# Patient Record
Sex: Female | Born: 1940
Health system: Southern US, Community
[De-identification: ages and names within clinical notes are randomized; demographics above are authoritative.]

## PROBLEM LIST (undated history)

## (undated) DIAGNOSIS — I10 Essential (primary) hypertension: Secondary | ICD-10-CM

## (undated) DIAGNOSIS — E785 Hyperlipidemia, unspecified: Secondary | ICD-10-CM

## (undated) DIAGNOSIS — I252 Old myocardial infarction: Secondary | ICD-10-CM

## (undated) HISTORY — PX: NO PAST SURGERIES: SHX2092

---

## 2008-06-03 ENCOUNTER — Emergency Department (HOSPITAL_BASED_OUTPATIENT_CLINIC_OR_DEPARTMENT_OTHER): Admission: EM | Admit: 2008-06-03 | Discharge: 2008-06-04 | Payer: Self-pay | Admitting: Emergency Medicine

## 2008-10-26 ENCOUNTER — Emergency Department (HOSPITAL_BASED_OUTPATIENT_CLINIC_OR_DEPARTMENT_OTHER): Admission: EM | Admit: 2008-10-26 | Discharge: 2008-10-26 | Payer: Self-pay | Admitting: Emergency Medicine

## 2011-08-30 LAB — POCT CARDIAC MARKERS: CKMB, poc: 1.3

## 2014-12-01 DIAGNOSIS — R7301 Impaired fasting glucose: Secondary | ICD-10-CM | POA: Diagnosis not present

## 2015-01-22 ENCOUNTER — Emergency Department (HOSPITAL_BASED_OUTPATIENT_CLINIC_OR_DEPARTMENT_OTHER)
Admission: EM | Admit: 2015-01-22 | Discharge: 2015-01-22 | Disposition: A | Discharge status: Home / Self Care | Payer: Medicare Other | Attending: Emergency Medicine | Admitting: Emergency Medicine

## 2015-01-22 ENCOUNTER — Encounter (HOSPITAL_BASED_OUTPATIENT_CLINIC_OR_DEPARTMENT_OTHER): Payer: Self-pay | Admitting: Emergency Medicine

## 2015-01-22 ENCOUNTER — Emergency Department (HOSPITAL_BASED_OUTPATIENT_CLINIC_OR_DEPARTMENT_OTHER): Payer: Medicare Other

## 2015-01-22 DIAGNOSIS — M4317 Spondylolisthesis, lumbosacral region: Secondary | ICD-10-CM | POA: Diagnosis not present

## 2015-01-22 DIAGNOSIS — I10 Essential (primary) hypertension: Secondary | ICD-10-CM | POA: Insufficient documentation

## 2015-01-22 DIAGNOSIS — Z79899 Other long term (current) drug therapy: Secondary | ICD-10-CM | POA: Diagnosis not present

## 2015-01-22 DIAGNOSIS — E785 Hyperlipidemia, unspecified: Secondary | ICD-10-CM | POA: Diagnosis not present

## 2015-01-22 DIAGNOSIS — M5489 Other dorsalgia: Secondary | ICD-10-CM

## 2015-01-22 DIAGNOSIS — M4316 Spondylolisthesis, lumbar region: Secondary | ICD-10-CM | POA: Diagnosis not present

## 2015-01-22 DIAGNOSIS — Z7982 Long term (current) use of aspirin: Secondary | ICD-10-CM | POA: Diagnosis not present

## 2015-01-22 DIAGNOSIS — M545 Low back pain: Secondary | ICD-10-CM | POA: Insufficient documentation

## 2015-01-22 DIAGNOSIS — Z880 Allergy status to penicillin: Secondary | ICD-10-CM | POA: Diagnosis not present

## 2015-01-22 DIAGNOSIS — M47896 Other spondylosis, lumbar region: Secondary | ICD-10-CM | POA: Diagnosis not present

## 2015-01-22 HISTORY — DX: Essential (primary) hypertension: I10

## 2015-01-22 HISTORY — DX: Hyperlipidemia, unspecified: E78.5

## 2015-01-22 LAB — URINALYSIS, ROUTINE W REFLEX MICROSCOPIC
Bilirubin Urine: NEGATIVE
GLUCOSE, UA: NEGATIVE mg/dL
KETONES UR: NEGATIVE mg/dL
Nitrite: NEGATIVE
PROTEIN: NEGATIVE mg/dL
Specific Gravity, Urine: 1.008 (ref 1.005–1.030)
UROBILINOGEN UA: 0.2 mg/dL (ref 0.0–1.0)
pH: 6 (ref 5.0–8.0)

## 2015-01-22 LAB — URINE MICROSCOPIC-ADD ON

## 2015-01-22 NOTE — ED Provider Notes (Signed)
CSN: [id number]     Arrival date & time 01/22/15  2040 History  This chart was scribed for Julie Chick, MD by Freida Busman, ED Scribe. This patient was seen in room MH04/MH04 and the patient's care was started 9:51 PM.    Chief Complaint  Patient presents with  . Back Pain    Patient is a 74 y.o. female presenting with back pain. The history is provided by the patient. No language interpreter was used.  Back Pain Location:  Lumbar spine Radiates to:  Does not radiate Timing:  Constant Chronicity:  New Relieved by:  NSAIDs Associated symptoms: no bladder incontinence, no bowel incontinence and no fever      HPI Comments:  DEMECIA Adkins is a 74 y.o. female who presents to the Emergency Department complaining of moderate lower back pain that started today. She noticed the pain shortly after sweeping. Pt states she work-outs 3 times a week but denies acute injury or recent strenuous activity. Her  pain has improved since taking an ibuprofen PTA. She denies weakness to her extremities, bladder/bowel incontinence and fever. She notes a h/o similar pain due to her sciatic nerve.  Past Medical History  Diagnosis Date  . Hypertension   . Hyperlipidemia    History reviewed. No pertinent past surgical history. History reviewed. No pertinent family history. History  Substance Use Topics  . Smoking status: Never Smoker   . Smokeless tobacco: Not on file  . Alcohol Use: No   OB History    No data available     Review of Systems  Constitutional: Negative for fever.  Gastrointestinal: Negative for bowel incontinence.  Genitourinary: Negative for bladder incontinence.  Musculoskeletal: Positive for back pain.  All other systems reviewed and are negative.     Allergies  Penicillins  Home Medications   Prior to Admission medications   Medication Sig Start Date End Date Taking? Authorizing Provider  aspirin 81 MG tablet Take 81 mg by mouth daily.   Yes Historical Provider, MD   nisoldipine (SULAR) 34 MG 24 hr tablet Take 34 mg by mouth daily.   Yes Historical Provider, MD  simvastatin (ZOCOR) 20 MG tablet Take 20 mg by mouth daily.   Yes Historical Provider, MD  triamterene-hydrochlorothiazide (DYAZIDE) 37.5-25 MG per capsule Take 1 capsule by mouth daily.   Yes Historical Provider, MD  vitamin B-12 (CYANOCOBALAMIN) 100 MCG tablet Take 100 mcg by mouth daily.   Yes Historical Provider, MD   BP 137/68 mmHg  Pulse 66  Temp(Src) 97.9 F (36.6 C) (Oral)  Resp 18  Ht  (1.626 m)  Wt 150 lb (68.04 kg)  BMI 25.73 kg/m2  SpO2 98%  Vitals reviewed Physical Exam  Physical Examination: General appearance - alert, well appearing, and in no distress Mental status - alert, oriented to person, place, and time Eyes - no conjunctival injection, no scleral icterus Chest - clear to auscultation, no wheezes, rales or rhonchi, symmetric air entry Heart - normal rate, regular rhythm, normal S1, S2, no murmurs, rubs, clicks or gallops Back exam -some midline lumbar tenderness to palpation, no CVA tenderness, bilateral mild lumbar tenderness to palpation Neurological - alert, orientedx 3, normal speech, normal gait, strength 5/5 in lower extremities Extremities - peripheral pulses normal, no pedal edema, no clubbing or cyanosis Skin - normal coloration and turgor  ED Course  Procedures   DIAGNOSTIC STUDIES:  Oxygen Saturation is 100% on RA, normal by my interpretation.    COORDINATION OF CARE:  9:56 PM will order back xray.   Discussed treatment plan with pt at bedside and pt agreed to plan.  Labs Review Labs Reviewed  URINALYSIS, ROUTINE W REFLEX MICROSCOPIC - Abnormal; Notable for the following:    Hgb urine dipstick SMALL (*)    Leukocytes, UA MODERATE (*)    All other components within normal limits  URINE MICROSCOPIC-ADD ON - Abnormal; Notable for the following:    Bacteria, UA FEW (*)    All other components within normal limits    Imaging Review Dg  Lumbar Spine Complete  01/22/2015   CLINICAL DATA:  Sudden onset of low back pain this afternoon. Pain across the low back. Sciatica. Scoliosis.  EXAM: LUMBAR SPINE - COMPLETE 4+ VIEW  COMPARISON:  11/11/2014.  FINDINGS: Severe multilevel lumbar spondylosis is present. There is a levoconvex curve with the apex at L3-L4. Vertebral body height is preserved. Grade I anterolisthesis of L3 on L4. Collapse of the disc spaces at L3 -L4, L4-L5 and L5-S1. Aortoiliac atherosclerosis is noted. Lower lumbar facet arthrosis.  IMPRESSION: Severe lumbar spondylosis.  No acute osseous abnormality.   Electronically Signed   By: Andreas NewportGeoffrey  Lamke M.D.   On: 01/22/2015 22:38     EKG Interpretation None      MDM   Final diagnoses:  Back pain without sciatica    Pt presenting with c/o low back pain which was relieved prior to arrival with ibuprofen. No signs or symptoms of cauda equina, no fever to suggest epidural abscess.  Xray shows significant degenerative disease which patient states she was already aware of.  Pt advised to arrange for followup with PMD.  Discharged with strict return precautions.  Pt agreeable with plan.  I personally performed the services described in this documentation, which was scribed in my presence. The recorded information has been reviewed and is accurate.    Julie ChickMartha K Linker, MD 01/24/15 [id number]

## 2015-01-22 NOTE — ED Notes (Signed)
Pt states she developed sudden onset of bilateral lower back pain, radiates to flank, no injury

## 2015-01-22 NOTE — Discharge Instructions (Signed)
Return to the ED with any concerns including weakness of legs, not able to urinate, loss of control of bowel or bladder, fever/chills, decreased level of alertness/lethargy, or any other alarming symptoms °

## 2015-03-13 DIAGNOSIS — Z1239 Encounter for other screening for malignant neoplasm of breast: Secondary | ICD-10-CM | POA: Diagnosis not present

## 2015-03-13 DIAGNOSIS — I10 Essential (primary) hypertension: Secondary | ICD-10-CM | POA: Diagnosis not present

## 2015-03-13 DIAGNOSIS — M5440 Lumbago with sciatica, unspecified side: Secondary | ICD-10-CM | POA: Diagnosis not present

## 2015-04-03 DIAGNOSIS — M5440 Lumbago with sciatica, unspecified side: Secondary | ICD-10-CM | POA: Diagnosis not present

## 2015-04-03 DIAGNOSIS — E559 Vitamin D deficiency, unspecified: Secondary | ICD-10-CM | POA: Diagnosis not present

## 2015-04-03 DIAGNOSIS — I10 Essential (primary) hypertension: Secondary | ICD-10-CM | POA: Diagnosis not present

## 2015-04-29 DIAGNOSIS — M545 Low back pain: Secondary | ICD-10-CM | POA: Diagnosis not present

## 2015-04-29 DIAGNOSIS — M5416 Radiculopathy, lumbar region: Secondary | ICD-10-CM | POA: Diagnosis not present

## 2015-05-21 DIAGNOSIS — Z0000 Encounter for general adult medical examination without abnormal findings: Secondary | ICD-10-CM | POA: Diagnosis not present

## 2015-07-13 DIAGNOSIS — S86912A Strain of unspecified muscle(s) and tendon(s) at lower leg level, left leg, initial encounter: Secondary | ICD-10-CM | POA: Diagnosis not present

## 2015-07-28 DIAGNOSIS — B351 Tinea unguium: Secondary | ICD-10-CM | POA: Diagnosis not present

## 2015-07-28 DIAGNOSIS — B353 Tinea pedis: Secondary | ICD-10-CM | POA: Diagnosis not present

## 2015-08-17 DIAGNOSIS — H2513 Age-related nuclear cataract, bilateral: Secondary | ICD-10-CM | POA: Diagnosis not present

## 2015-08-17 DIAGNOSIS — H4011X4 Primary open-angle glaucoma, indeterminate stage: Secondary | ICD-10-CM | POA: Diagnosis not present

## 2015-08-17 DIAGNOSIS — H25013 Cortical age-related cataract, bilateral: Secondary | ICD-10-CM | POA: Diagnosis not present

## 2015-10-16 DIAGNOSIS — E559 Vitamin D deficiency, unspecified: Secondary | ICD-10-CM | POA: Diagnosis not present

## 2015-10-16 DIAGNOSIS — E876 Hypokalemia: Secondary | ICD-10-CM | POA: Diagnosis not present

## 2015-10-16 DIAGNOSIS — Z0000 Encounter for general adult medical examination without abnormal findings: Secondary | ICD-10-CM | POA: Diagnosis not present

## 2015-10-16 DIAGNOSIS — E785 Hyperlipidemia, unspecified: Secondary | ICD-10-CM | POA: Diagnosis not present

## 2015-10-16 DIAGNOSIS — R1032 Left lower quadrant pain: Secondary | ICD-10-CM | POA: Diagnosis not present

## 2015-10-16 DIAGNOSIS — I10 Essential (primary) hypertension: Secondary | ICD-10-CM | POA: Diagnosis not present

## 2015-10-16 DIAGNOSIS — R1031 Right lower quadrant pain: Secondary | ICD-10-CM | POA: Diagnosis not present

## 2015-10-20 DIAGNOSIS — E559 Vitamin D deficiency, unspecified: Secondary | ICD-10-CM | POA: Diagnosis not present

## 2015-10-20 DIAGNOSIS — Z0000 Encounter for general adult medical examination without abnormal findings: Secondary | ICD-10-CM | POA: Diagnosis not present

## 2015-10-20 DIAGNOSIS — E876 Hypokalemia: Secondary | ICD-10-CM | POA: Diagnosis not present

## 2015-10-20 DIAGNOSIS — M25562 Pain in left knee: Secondary | ICD-10-CM | POA: Diagnosis not present

## 2015-10-20 DIAGNOSIS — M2540 Effusion, unspecified joint: Secondary | ICD-10-CM | POA: Diagnosis not present

## 2015-10-20 DIAGNOSIS — I10 Essential (primary) hypertension: Secondary | ICD-10-CM | POA: Diagnosis not present

## 2015-10-20 DIAGNOSIS — Z23 Encounter for immunization: Secondary | ICD-10-CM | POA: Diagnosis not present

## 2015-10-20 DIAGNOSIS — M5416 Radiculopathy, lumbar region: Secondary | ICD-10-CM | POA: Diagnosis not present

## 2015-10-20 DIAGNOSIS — E785 Hyperlipidemia, unspecified: Secondary | ICD-10-CM | POA: Diagnosis not present

## 2015-10-21 DIAGNOSIS — M25562 Pain in left knee: Secondary | ICD-10-CM | POA: Diagnosis not present

## 2015-11-09 DIAGNOSIS — Z01419 Encounter for gynecological examination (general) (routine) without abnormal findings: Secondary | ICD-10-CM | POA: Diagnosis not present

## 2015-11-09 DIAGNOSIS — Z124 Encounter for screening for malignant neoplasm of cervix: Secondary | ICD-10-CM | POA: Diagnosis not present

## 2015-11-10 DIAGNOSIS — Z01419 Encounter for gynecological examination (general) (routine) without abnormal findings: Secondary | ICD-10-CM | POA: Diagnosis not present

## 2015-12-08 DIAGNOSIS — M5416 Radiculopathy, lumbar region: Secondary | ICD-10-CM | POA: Diagnosis not present

## 2015-12-08 DIAGNOSIS — M545 Low back pain: Secondary | ICD-10-CM | POA: Diagnosis not present

## 2016-02-09 ENCOUNTER — Ambulatory Visit (INDEPENDENT_AMBULATORY_CARE_PROVIDER_SITE_OTHER): Payer: Medicare Other | Admitting: Internal Medicine

## 2016-02-09 ENCOUNTER — Ambulatory Visit: Payer: Self-pay | Admitting: Internal Medicine

## 2016-02-09 ENCOUNTER — Encounter: Payer: Self-pay | Admitting: Internal Medicine

## 2016-02-09 VITALS — BP 116/78 | HR 60 | Temp 97.9°F | Resp 16 | Wt 148.0 lb

## 2016-02-09 DIAGNOSIS — I10 Essential (primary) hypertension: Secondary | ICD-10-CM | POA: Insufficient documentation

## 2016-02-09 DIAGNOSIS — M5431 Sciatica, right side: Secondary | ICD-10-CM | POA: Insufficient documentation

## 2016-02-09 DIAGNOSIS — E785 Hyperlipidemia, unspecified: Secondary | ICD-10-CM | POA: Insufficient documentation

## 2016-02-09 MED ORDER — B COMPLEX PO CAPS: ORAL_CAPSULE | ORAL | Status: AC

## 2016-02-09 MED ORDER — VITAMIN D3 50 MCG (2000 UT) PO TABS: ORAL_TABLET | ORAL | Status: AC

## 2016-02-09 NOTE — Assessment & Plan Note (Signed)
Cholesterol has been controlled per pt - on zocor -- blood work done in the last 6 months Exercising Healthy diet Blood work at next visit in 6 months

## 2016-02-09 NOTE — Assessment & Plan Note (Signed)
Exercising only, not taking meds

## 2016-02-09 NOTE — Progress Notes (Signed)
Pre visit review using our clinic review tool, if applicable. No additional management support is needed unless otherwise documented below in the visit note. 

## 2016-02-09 NOTE — Progress Notes (Signed)
Subjective:    Patient ID: Julie Adkins, female    DOB: 01/26/1941, 75 y.o.   MRN: [id number]  HPI She is here to establish with a new pcp.    Hyperlipidemia: She is taking her medication daily. She is compliant with a low fat/cholesterol diet. She is exercising regularly, three times a week. She denies myalgias.   Hypertension: She is taking her medication daily. She is compliant with a low sodium diet.  She denies chest pain, palpitations, edema, shortness of breath and regular headaches. She is exercising regularly.  She does monitor her blood pressure at home, it is well controlled.    Sciatica:  She has chronic pain on a daily basis.  She exercises regularly and it helps. She does not any medication.  She can have pain down her right leg at times when it is severe.   She was told she had a heart murmur recently.  Years ago she was told she had a heart attack, but later was told she did not.  She has a family history of heart disease and wants to see cardiology   Medications and allergies reviewed with patient and updated if appropriate.  There are no active problems to display for this patient.   Current Outpatient Prescriptions on File Prior to Visit  Medication Sig Dispense Refill  . aspirin 81 MG tablet Take 81 mg by mouth daily.    . nisoldipine (SULAR) 34 MG 24 hr tablet Take 34 mg by mouth daily.    . simvastatin (ZOCOR) 20 MG tablet Take 20 mg by mouth daily.    Marland Kitchen triamterene-hydrochlorothiazide (DYAZIDE) 37.5-25 MG per capsule Take 1 capsule by mouth daily.    . vitamin B-12 (CYANOCOBALAMIN) 100 MCG tablet Take 100 mcg by mouth daily.     No current facility-administered medications on file prior to visit.    Past Medical History  Diagnosis Date  . Hypertension   . Hyperlipidemia     No past surgical history on file.  Social History   Social History  . Marital Status: Widowed    Spouse Name: N/A  . Number of Children: N/A  . Years of Education: N/A    Social History Main Topics  . Smoking status: Never Smoker   . Smokeless tobacco: None  . Alcohol Use: No  . Drug Use: None  . Sexual Activity: Not Asked   Other Topics Concern  . None   Social History Narrative    No family history on file.  Review of Systems  Constitutional: Negative for fever, chills and appetite change.  Respiratory: Negative for cough, shortness of breath and wheezing.   Cardiovascular: Positive for palpitations (occasionally). Negative for chest pain and leg swelling.  Gastrointestinal: Negative for nausea, abdominal pain, diarrhea, constipation and blood in stool.       Rare GERD  Genitourinary: Negative for dysuria and hematuria.  Musculoskeletal: Positive for back pain (sciatica). Negative for myalgias.  Skin: Negative for color change and rash.  Neurological: Negative for dizziness, light-headedness and headaches.  Psychiatric/Behavioral: Positive for sleep disturbance (occsional difficulty). Negative for dysphoric mood. The patient is not nervous/anxious.        Objective:   Filed Vitals:   02/09/16 1301  BP: 116/78  Pulse: 60  Temp: 97.9 F (36.6 C)  Resp: 16   Filed Weights   02/09/16 1301  Weight: 148 lb (67.132 kg)   Body mass index is 25.39 kg/(m^2).   Physical Exam Constitutional: She appears  well-developed and well-nourished. No distress.  HENT:  Head: Normocephalic and atraumatic.  Right Ear: External ear normal. Normal ear canal and TM Left Ear: External ear normal.  Normal ear canal and TM Mouth/Throat: Oropharynx is clear and moist.  Normal bilateral ear canals and tympanic membranes  Eyes: Conjunctivae and EOM are normal.  Neck: Neck supple. No tracheal deviation present. No thyromegaly present.  No carotid bruit  Cardiovascular: Normal rate, regular rhythm and normal heart sounds.   2/6 systolic murmur.  No edema. Pulmonary/Chest: Effort normal and breath sounds normal. No respiratory distress. She has no wheezes.  She has no rales.  Abdominal: Soft. She exhibits no distension. There is no tenderness.  Lymphadenopathy: She has no cervical adenopathy.  Skin: Skin is warm and dry. She is not diaphoretic.  Psychiatric: She has a normal mood and affect. Her behavior is normal.       Assessment & Plan:   See Problem List for Assessment and Plan of chronic medical problems.  Follow up in 6 months  Will have records transferred - HM up to date per patient

## 2016-02-09 NOTE — Assessment & Plan Note (Signed)
BP well controlled Current regimen effective and well tolerated Continue current medications at current doses  

## 2016-02-09 NOTE — Patient Instructions (Signed)
   All other Health Maintenance issues reviewed.   All recommended immunizations and age-appropriate screenings are up-to-date.  No immunizations administered today.   Medications reviewed and updated.  No changes recommended at this time.  Your prescription(s) have been submitted to your pharmacy. Please take as directed and contact our office if you believe you are having problem(s) with the medication(s).  A referral was ordered for cardiology  Please followup in 6 months

## 2016-04-29 DIAGNOSIS — M5441 Lumbago with sciatica, right side: Secondary | ICD-10-CM | POA: Diagnosis not present

## 2016-04-29 DIAGNOSIS — M5416 Radiculopathy, lumbar region: Secondary | ICD-10-CM | POA: Diagnosis not present

## 2016-04-29 DIAGNOSIS — M5442 Lumbago with sciatica, left side: Secondary | ICD-10-CM | POA: Diagnosis not present

## 2016-04-29 DIAGNOSIS — G8929 Other chronic pain: Secondary | ICD-10-CM | POA: Diagnosis not present

## 2016-04-29 DIAGNOSIS — M5126 Other intervertebral disc displacement, lumbar region: Secondary | ICD-10-CM | POA: Diagnosis not present

## 2016-05-04 ENCOUNTER — Ambulatory Visit: Payer: Medicare Other | Admitting: Cardiology

## 2016-05-26 ENCOUNTER — Other Ambulatory Visit (INDEPENDENT_AMBULATORY_CARE_PROVIDER_SITE_OTHER): Payer: Medicare Other

## 2016-05-26 ENCOUNTER — Encounter: Payer: Self-pay | Admitting: Family

## 2016-05-26 ENCOUNTER — Telehealth: Payer: Self-pay | Admitting: Family

## 2016-05-26 ENCOUNTER — Ambulatory Visit (INDEPENDENT_AMBULATORY_CARE_PROVIDER_SITE_OTHER): Payer: Medicare Other | Admitting: Family

## 2016-05-26 VITALS — BP 118/80 | HR 62 | Temp 97.7°F | Ht 64.0 in | Wt 142.2 lb

## 2016-05-26 DIAGNOSIS — R1030 Lower abdominal pain, unspecified: Secondary | ICD-10-CM

## 2016-05-26 DIAGNOSIS — N390 Urinary tract infection, site not specified: Secondary | ICD-10-CM

## 2016-05-26 DIAGNOSIS — E876 Hypokalemia: Secondary | ICD-10-CM

## 2016-05-26 LAB — URINALYSIS, ROUTINE W REFLEX MICROSCOPIC
BILIRUBIN URINE: NEGATIVE
Nitrite: NEGATIVE
PH: 6.5 (ref 5.0–8.0)
SPECIFIC GRAVITY, URINE: 1.015 (ref 1.000–1.030)
UROBILINOGEN UA: 0.2 (ref 0.0–1.0)
Urine Glucose: NEGATIVE

## 2016-05-26 LAB — COMPREHENSIVE METABOLIC PANEL
ALK PHOS: 72 U/L (ref 39–117)
ALT: 21 U/L (ref 0–35)
AST: 26 U/L (ref 0–37)
Albumin: 4.3 g/dL (ref 3.5–5.2)
BILIRUBIN TOTAL: 0.6 mg/dL (ref 0.2–1.2)
BUN: 17 mg/dL (ref 6–23)
CALCIUM: 11 mg/dL — AB (ref 8.4–10.5)
CO2: 31 meq/L (ref 19–32)
CREATININE: 0.79 mg/dL (ref 0.40–1.20)
Chloride: 102 mEq/L (ref 96–112)
GFR: 90.94 mL/min (ref >60.00)
Glucose, Bld: 89 mg/dL (ref 70–99)
Potassium: 2.9 mEq/L — ABNORMAL LOW (ref 3.5–5.1)
Sodium: 140 mEq/L (ref 135–145)
TOTAL PROTEIN: 7.8 g/dL (ref 6.0–8.3)

## 2016-05-26 LAB — CBC WITH DIFFERENTIAL/PLATELET
BASOS PCT: 0.5 % (ref 0.0–3.0)
Basophils Absolute: 0 10*3/uL (ref 0.0–0.1)
EOS ABS: 0.1 10*3/uL (ref 0.0–0.7)
Eosinophils Relative: 1.5 % (ref 0.0–5.0)
HCT: 44.7 % (ref 36.0–46.0)
HEMOGLOBIN: 15.2 g/dL — AB (ref 12.0–15.0)
Lymphocytes Relative: 42.6 % (ref 12.0–46.0)
Lymphs Abs: 2.3 10*3/uL (ref 0.7–4.0)
MCHC: 34.1 g/dL (ref 30.0–36.0)
MCV: 89.9 fl (ref 78.0–100.0)
MONO ABS: 0.5 10*3/uL (ref 0.1–1.0)
Monocytes Relative: 9.2 % (ref 3.0–12.0)
NEUTROS ABS: 2.5 10*3/uL (ref 1.4–7.7)
Neutrophils Relative %: 46.2 % (ref 43.0–77.0)
PLATELETS: 281 10*3/uL (ref 150.0–400.0)
RBC: 4.97 Mil/uL (ref 3.87–5.11)
RDW: 14.8 % (ref 11.5–15.5)
WBC: 5.4 10*3/uL (ref 4.0–10.5)

## 2016-05-26 LAB — LIPASE: Lipase: 13 U/L (ref 11.0–59.0)

## 2016-05-26 MED ORDER — POTASSIUM CHLORIDE CRYS ER 20 MEQ PO TBCR: 20.0000 meq | EXTENDED_RELEASE_TABLET | Freq: Every day | ORAL | Status: DC

## 2016-05-26 MED ORDER — NITROFURANTOIN MONOHYD MACRO 100 MG PO CAPS: 100.0000 mg | ORAL_CAPSULE | Freq: Two times a day (BID) | ORAL | Status: DC

## 2016-05-26 NOTE — Progress Notes (Signed)
Subjective:    Patient ID: Julie Adkins, female    DOB: 11/11/1941, 75 y.o.   MRN: [id number]   Julie Adkins is a 75 y.o. female who presents today for an acute visit.    HPI Comments: Patient here for evaluation of left-sided pain. No pain today. Two brief episodes of shooting, sharp left lower abdomen which resolve in seconds. Endorses abdominal bloating over last couple of weeks. First episode two weeks ago and second episode was yesterday. Pain doesn't radiate. Notes she has sciatic and played golf 2 days ago. No injury. No numbness, tingling, dysuria, abdominal pain. Pain not a/w meals. No h/o cancer.  Note, recently noted umbilical hernia.   Has no surgical history including NO hysterectomy.      Past Medical History  Diagnosis Date  . Hypertension   . Hyperlipidemia    Allergies: Penicillins Current Outpatient Prescriptions on File Prior to Visit  Medication Sig Dispense Refill  . aspirin 81 MG tablet Take 81 mg by mouth daily.    . B Complex CAPS Taking daily  0  . Cholecalciferol (VITAMIN D3) 2000 units TABS Taking daily 30 tablet   . simvastatin (ZOCOR) 20 MG tablet Take 20 mg by mouth daily.    Marland Kitchen. triamterene-hydrochlorothiazide (DYAZIDE) 37.5-25 MG per capsule Take 1 capsule by mouth daily.     No current facility-administered medications on file prior to visit.    Social History  Substance Use Topics  . Smoking status: Never Smoker   . Smokeless tobacco: Never Used  . Alcohol Use: No    Review of Systems  Constitutional: Negative for fever, chills and unexpected weight change.  HENT: Negative for congestion, ear pain, sinus pressure and sore throat.   Respiratory: Negative for cough, shortness of breath and wheezing.   Cardiovascular: Negative for chest pain, palpitations and leg swelling.  Gastrointestinal: Negative for nausea, vomiting, abdominal pain, diarrhea, constipation, blood in stool and abdominal distention.  Genitourinary: Negative for dysuria  and hematuria.  Musculoskeletal: Negative for myalgias and arthralgias.  Neurological: Negative for headaches.      Objective:    BP 118/80 mmHg  Pulse 62  Temp(Src) 97.7 F (36.5 C) (Oral)  Ht 5\' 4"  (1.626 m)  Wt 142 lb 4 oz (64.524 kg)  BMI 24.41 kg/m2  SpO2 97%   Physical Exam  Constitutional: She appears well-developed and well-nourished.  Eyes: Conjunctivae are normal.  Cardiovascular: Normal rate, regular rhythm, normal heart sounds and normal pulses.   Pulmonary/Chest: Effort normal and breath sounds normal. She has no wheezes. She has no rhonchi. She has no rales.  Abdominal: Soft. Normal appearance and bowel sounds are normal. She exhibits no distension, no fluid wave, no ascites and no mass. There is no tenderness. There is no rigidity, no rebound, no guarding and no CVA tenderness.  Unable to appreciate any distention. Small, reducible umbilical hernia. Unable to appreciate any bowel.  Neurological: She is alert.  Skin: Skin is warm and dry.  Psychiatric: She has a normal mood and affect. Her speech is normal and behavior is normal. Thought content normal.  Vitals reviewed.      Assessment & Plan:  1. Lower abdominal pain Concern for ovarian mass due to vague symptoms. Alternately, consider muscle strain or constipation.   - Urinalysis, Routine w reflex microscopic ([facility]); Future - Comprehensive metabolic panel; Future - Lipase; Future - CBC with Differential/Platelet; Future - CT Abdomen Pelvis W Contrast; Future     I have  discontinued [name]'s nisoldipine. I am also having her maintain her simvastatin, aspirin, triamterene-hydrochlorothiazide, Vitamin D3, B Complex, amLODipine, chlorthalidone, traMADol, and multivitamin.   Meds ordered this encounter  Medications  . amLODipine (NORVASC) 5 MG tablet    Sig:   . chlorthalidone (HYGROTON) 25 MG tablet    Sig:   . traMADol (ULTRAM) 50 MG tablet    Sig: Take 50 mg by mouth.  . Multiple Vitamin  (MULTIVITAMIN) tablet    Sig: Take by mouth.     Start medications as prescribed and explained to patient on After Visit Summary ( AVS). Risks, benefits, and alternatives of the medications and treatment plan prescribed today were discussed, and patient expressed understanding.   Education regarding symptom management and diagnosis given to patient.   Follow-up:Plan follow-up and return precautions given if any worsening symptoms or change in condition.   Continue to follow with Pincus SanesStacy J Burns, MD for routine health maintenance.   Julie Adkins and I agreed with plan.   Rennie PlowmanMargaret Shaliah Wann, FNP

## 2016-05-26 NOTE — Progress Notes (Signed)
Pre visit review using our clinic review tool, if applicable. No additional management support is needed unless otherwise documented below in the visit note. 

## 2016-05-26 NOTE — Telephone Encounter (Signed)
Spoke with patient regarding low potassium and elevated calcium. We will start a potassium supplement for the next couple days and she will come in on Monday to have her labs rechecked. I also went through her medication list more thoroughly and she is actually not taking the Dyazide. I also told her about urinary tract infection. She believes the pain that she was having was related to urine now and said that if her bloating and pain resolves with antibiotic, she will cancel the CT for abdomen and pelvis.  I discontinued the chlorthalidone due to the hypokalemia;we jointly decided that she may be able to control her blood pressure well on amlodipine 5 mg only. We're trialing this for next couple days and patient will monitor her blood pressure at home BP cuff.

## 2016-05-26 NOTE — Patient Instructions (Signed)
As we discussed, we will do some testing to ensure were not missing anything more serious. Labs and we will call you about CT abdomen.   More than likely, as we discussed, I suspect the pain may be related to muscle strain or muscle spasm due to golf. Please continue to pay very close attention to pain and when it occurs.  If there is no improvement in your symptoms, or if there is any worsening of symptoms, or if you have any additional concerns, please return for re-evaluation; or, if we are closed, consider going to the Emergency Room for evaluation if symptoms urgent.

## 2016-06-01 ENCOUNTER — Other Ambulatory Visit: Payer: Self-pay

## 2016-06-01 ENCOUNTER — Ambulatory Visit: Payer: Medicare Other

## 2016-06-01 ENCOUNTER — Other Ambulatory Visit (INDEPENDENT_AMBULATORY_CARE_PROVIDER_SITE_OTHER): Payer: Medicare Other

## 2016-06-01 VITALS — BP 150/72

## 2016-06-01 DIAGNOSIS — E876 Hypokalemia: Secondary | ICD-10-CM

## 2016-06-01 DIAGNOSIS — I10 Essential (primary) hypertension: Secondary | ICD-10-CM

## 2016-06-01 LAB — COMPREHENSIVE METABOLIC PANEL
ALBUMIN: 4.4 g/dL (ref 3.5–5.2)
ALT: 21 U/L (ref 0–35)
AST: 26 U/L (ref 0–37)
Alkaline Phosphatase: 68 U/L (ref 39–117)
BILIRUBIN TOTAL: 0.6 mg/dL (ref 0.2–1.2)
BUN: 18 mg/dL (ref 6–23)
CALCIUM: 10.7 mg/dL — AB (ref 8.4–10.5)
CHLORIDE: 101 meq/L (ref 96–112)
CO2: 30 mEq/L (ref 19–32)
CREATININE: 0.74 mg/dL (ref 0.40–1.20)
GFR: 98.07 mL/min (ref >60.00)
Glucose, Bld: 78 mg/dL (ref 70–99)
Potassium: 3.2 mEq/L — ABNORMAL LOW (ref 3.5–5.1)
Sodium: 139 mEq/L (ref 135–145)
Total Protein: 7.6 g/dL (ref 6.0–8.3)

## 2016-06-01 MED ORDER — TRIAMTERENE-HCTZ 37.5-25 MG PO CAPS: 1.0000 | ORAL_CAPSULE | Freq: Every day | ORAL | Status: DC

## 2016-06-01 NOTE — Telephone Encounter (Signed)
Called patient and explained -   Stop chlorthalidone and potassium chloride.  Start Dyazide which should help BP and normalize potassium.   Calcium still remains elevated.   Cancel CT pelvis.   Better from UTI.

## 2016-06-01 NOTE — Telephone Encounter (Signed)
Noted  

## 2016-06-01 NOTE — Telephone Encounter (Signed)
Pt come in for a nurse visit today 06/01/16 and her BP read 150/72. She was concerned about the number going up so she wanted me to inform you that she is going back on her fluid pill until she sees you in a couple of weeks. She wants to talk about going on a calcium supplement instead of being taken off the fluid pill due to her calcium being low.

## 2016-06-02 ENCOUNTER — Telehealth: Payer: Self-pay | Admitting: Internal Medicine

## 2016-06-02 ENCOUNTER — Telehealth: Payer: Self-pay | Admitting: Family

## 2016-06-02 DIAGNOSIS — I10 Essential (primary) hypertension: Secondary | ICD-10-CM

## 2016-06-02 MED ORDER — TRIAMTERENE-HCTZ 37.5-25 MG PO CAPS: 1.0000 | ORAL_CAPSULE | Freq: Every day | ORAL | Status: DC

## 2016-06-02 NOTE — Telephone Encounter (Signed)
Spoke with patient about lab results. She will come back next week to recheck calcium and K. She will continue monitor BPs.  She has no abdominal pain and would like for me to cancel CT abdomen.   She will stop chlorthalidone and start Dyazide.

## 2016-06-08 ENCOUNTER — Other Ambulatory Visit: Payer: Self-pay

## 2016-06-09 ENCOUNTER — Telehealth: Payer: Self-pay | Admitting: Internal Medicine

## 2016-06-09 DIAGNOSIS — R300 Dysuria: Secondary | ICD-10-CM

## 2016-06-09 NOTE — Telephone Encounter (Signed)
Patient states Dr. Lawerance BachBurns was treating her for a UTI.  Patient states she believes she still has a UTI.  Patient is requesting order for lab.

## 2016-06-09 NOTE — Telephone Encounter (Signed)
Please advise, pt has UA done by Claris CheMargaret on 05/26/16

## 2016-06-09 NOTE — Telephone Encounter (Signed)
Entered, LVM informing pt.

## 2016-06-09 NOTE — Telephone Encounter (Signed)
Check UA, Urine cx

## 2016-06-10 ENCOUNTER — Other Ambulatory Visit (INDEPENDENT_AMBULATORY_CARE_PROVIDER_SITE_OTHER): Payer: Medicare Other

## 2016-06-10 ENCOUNTER — Ambulatory Visit: Payer: Medicare Other

## 2016-06-10 VITALS — BP 116/68

## 2016-06-10 DIAGNOSIS — I10 Essential (primary) hypertension: Secondary | ICD-10-CM

## 2016-06-10 DIAGNOSIS — R300 Dysuria: Secondary | ICD-10-CM | POA: Diagnosis not present

## 2016-06-10 LAB — URINALYSIS, ROUTINE W REFLEX MICROSCOPIC
BILIRUBIN URINE: NEGATIVE
KETONES UR: NEGATIVE
NITRITE: NEGATIVE
SPECIFIC GRAVITY, URINE: 1.025 (ref 1.000–1.030)
Urine Glucose: NEGATIVE
Urobilinogen, UA: 1 (ref 0.0–1.0)
pH: 6 (ref 5.0–8.0)

## 2016-06-11 LAB — URINE CULTURE

## 2016-06-13 ENCOUNTER — Other Ambulatory Visit (INDEPENDENT_AMBULATORY_CARE_PROVIDER_SITE_OTHER): Payer: Medicare Other

## 2016-06-13 ENCOUNTER — Telehealth: Payer: Self-pay | Admitting: Emergency Medicine

## 2016-06-13 DIAGNOSIS — R300 Dysuria: Secondary | ICD-10-CM

## 2016-06-13 DIAGNOSIS — R109 Unspecified abdominal pain: Secondary | ICD-10-CM

## 2016-06-13 DIAGNOSIS — K30 Functional dyspepsia: Secondary | ICD-10-CM

## 2016-06-13 LAB — URINALYSIS, ROUTINE W REFLEX MICROSCOPIC
BILIRUBIN URINE: NEGATIVE
KETONES UR: NEGATIVE
NITRITE: NEGATIVE
Specific Gravity, Urine: 1.01 (ref 1.000–1.030)
TOTAL PROTEIN, URINE-UPE24: NEGATIVE
URINE GLUCOSE: NEGATIVE
UROBILINOGEN UA: 0.2 (ref 0.0–1.0)
pH: 7.5 (ref 5.0–8.0)

## 2016-06-13 NOTE — Telephone Encounter (Signed)
Urine culture re entered for pt to come back in for a better sample.

## 2016-06-15 LAB — URINE CULTURE

## 2016-07-08 DIAGNOSIS — Z1231 Encounter for screening mammogram for malignant neoplasm of breast: Secondary | ICD-10-CM | POA: Diagnosis not present

## 2016-07-27 ENCOUNTER — Other Ambulatory Visit: Payer: Self-pay | Admitting: *Deleted

## 2016-07-27 MED ORDER — SIMVASTATIN 20 MG PO TABS: 20.0000 mg | ORAL_TABLET | Freq: Every day | ORAL | 1 refills | Status: DC

## 2016-08-11 ENCOUNTER — Ambulatory Visit (INDEPENDENT_AMBULATORY_CARE_PROVIDER_SITE_OTHER): Payer: Medicare Other | Admitting: Internal Medicine

## 2016-08-11 ENCOUNTER — Telehealth: Payer: Self-pay | Admitting: Internal Medicine

## 2016-08-11 ENCOUNTER — Encounter: Payer: Self-pay | Admitting: Internal Medicine

## 2016-08-11 ENCOUNTER — Other Ambulatory Visit (INDEPENDENT_AMBULATORY_CARE_PROVIDER_SITE_OTHER): Payer: Medicare Other

## 2016-08-11 VITALS — BP 134/86 | HR 67 | Temp 97.9°F | Resp 16 | Wt 147.0 lb

## 2016-08-11 DIAGNOSIS — I10 Essential (primary) hypertension: Secondary | ICD-10-CM

## 2016-08-11 DIAGNOSIS — E876 Hypokalemia: Secondary | ICD-10-CM | POA: Diagnosis not present

## 2016-08-11 DIAGNOSIS — E785 Hyperlipidemia, unspecified: Secondary | ICD-10-CM

## 2016-08-11 LAB — COMPREHENSIVE METABOLIC PANEL
ALT: 19 U/L (ref 0–35)
AST: 24 U/L (ref 0–37)
Albumin: 4.3 g/dL (ref 3.5–5.2)
Alkaline Phosphatase: 73 U/L (ref 39–117)
BUN: 14 mg/dL (ref 6–23)
CALCIUM: 10.4 mg/dL (ref 8.4–10.5)
CHLORIDE: 104 meq/L (ref 96–112)
CO2: 31 meq/L (ref 19–32)
CREATININE: 0.67 mg/dL (ref 0.40–1.20)
GFR: 109.93 mL/min (ref >60.00)
Glucose, Bld: 69 mg/dL — ABNORMAL LOW (ref 70–99)
Potassium: 3.2 mEq/L — ABNORMAL LOW (ref 3.5–5.1)
Sodium: 141 mEq/L (ref 135–145)
Total Bilirubin: 0.5 mg/dL (ref 0.2–1.2)
Total Protein: 7.6 g/dL (ref 6.0–8.3)

## 2016-08-11 NOTE — Progress Notes (Signed)
Subjective:    Patient ID: Julie Adkins, female    DOB: 12-12-1940, 75 y.o.   MRN: [id number]  HPI The patient is here for follow up.  Hypertension: She is taking her medication daily. She is compliant with a low sodium diet.  She denies chest pain, palpitations, edema, shortness of breath and regular headaches. She is exercising regularly.  She does not monitor her blood pressure at home.    Hypokalemia:  She has been taking potasium over the counter. She had stopped this a while ago and believes that this why her potassium was low. She has never needed to take prescription potassium past.  Hyperlipidemia: She is taking her medication daily. She is compliant with a low fat/cholesterol diet. She is exercising regularly. She denies myalgias.   Mole under breast:  She has a relatively new mole under her breasts. It was itchy, but has stopped itching.  Medications and allergies reviewed with patient and updated if appropriate.  Patient Active Problem List   Diagnosis Date Noted  . Essential hypertension, benign 02/09/2016  . Hyperlipidemia 02/09/2016  . Sciatica of right side 02/09/2016    Current Outpatient Prescriptions on File Prior to Visit  Medication Sig Dispense Refill  . amLODipine (NORVASC) 5 MG tablet     . aspirin 81 MG tablet Take 81 mg by mouth daily.    . B Complex CAPS Taking daily  0  . Cholecalciferol (VITAMIN D3) 2000 units TABS Taking daily 30 tablet   . Multiple Vitamin (MULTIVITAMIN) tablet Take by mouth.    . simvastatin (ZOCOR) 20 MG tablet Take 1 tablet (20 mg total) by mouth daily. 90 tablet 1  . triamterene-hydrochlorothiazide (DYAZIDE) 37.5-25 MG capsule Take 1 each (1 capsule total) by mouth daily. 30 capsule 2   No current facility-administered medications on file prior to visit.     Past Medical History:  Diagnosis Date  . Hyperlipidemia   . Hypertension     Past Surgical History:  Procedure Laterality Date  . NO PAST SURGERIES       Social History   Social History  . Marital status: Widowed    Spouse name: N/A  . Number of children: 4  . Years of education: N/A   Social History Main Topics  . Smoking status: Never Smoker  . Smokeless tobacco: Never Used  . Alcohol use No  . Drug use: No  . Sexual activity: Not on file   Other Topics Concern  . Not on file   Social History Narrative   Widow, lives alone      Exercises regularly      4 children    No family history on file.  Review of Systems  Constitutional: Negative for fever.  Respiratory: Negative for cough, shortness of breath and wheezing.   Cardiovascular: Negative for chest pain, palpitations and leg swelling.  Musculoskeletal:       Joint stiffness in morning  Neurological: Negative for dizziness, light-headedness and headaches.       Objective:   Vitals:   08/11/16 1049  BP: 134/86  Pulse: 67  Resp: 16  Temp: 97.9 F (36.6 C)   Filed Weights   08/11/16 1049  Weight: 147 lb (66.7 kg)   Body mass index is 25.23 kg/m.   Physical Exam    Constitutional: Appears well-developed and well-nourished. No distress.  HENT:  Head: Normocephalic and atraumatic.  Neck: Neck supple. No tracheal deviation present. No thyromegaly present.  Cardiovascular: Normal rate,  regular rhythm and normal heart sounds.   No murmur heard. No carotid bruit  Pulmonary/Chest: Effort normal and breath sounds normal. No respiratory distress. No has no wheezes. No rales.  Musculoskeletal: No edema.  Lymphadenopathy: No cervical adenopathy.  Skin: Skin is warm and dry. Not diaphoretic.  Psychiatric: Normal mood and affect. Behavior is normal.     Assessment & Plan:    See Problem List for Assessment and Plan of chronic medical problems.

## 2016-08-11 NOTE — Assessment & Plan Note (Addendum)
BP Readings from Last 3 Encounters:  08/11/16 134/86  06/10/16 116/68  06/01/16 (!) 150/72   Overall controlled Current regimen effective and well tolerated Continue current medications at current doses EKG today

## 2016-08-11 NOTE — Assessment & Plan Note (Signed)
Nonfasting today so we will check lipid panel in her next appointment Continue statin

## 2016-08-11 NOTE — Patient Instructions (Addendum)
  Test(s) ordered today. Your results will be released to MyChart (or called to you) after review, usually within 72hours after test completion. If any changes need to be made, you will be notified at that same time.  No immunizations administered today.   Medications reviewed and updated.  No changes recommended at this time.  An EKG was done today  Please followup in 6 months

## 2016-08-11 NOTE — Assessment & Plan Note (Signed)
Related to dyazide Restarted taking otc potassium

## 2016-08-11 NOTE — Progress Notes (Signed)
Pre visit review using our clinic review tool, if applicable. No additional management support is needed unless otherwise documented below in the visit note. 

## 2016-08-11 NOTE — Telephone Encounter (Signed)
Please let her know her EKG is within normal limits.  We have not received records from her previous doctor and ideally she should have been transferred.

## 2016-08-12 ENCOUNTER — Telehealth: Payer: Self-pay | Admitting: Internal Medicine

## 2016-08-12 DIAGNOSIS — E876 Hypokalemia: Secondary | ICD-10-CM

## 2016-08-12 NOTE — Telephone Encounter (Signed)
Patient called back.  I gave lab results.  She does want prescription potassium sent to [facility].

## 2016-08-12 NOTE — Telephone Encounter (Signed)
Informed pt yesterday at visit we would only call if EKG was abnormal. She was okay with that.

## 2016-08-15 MED ORDER — POTASSIUM CHLORIDE CRYS ER 20 MEQ PO TBCR: 20.0000 meq | EXTENDED_RELEASE_TABLET | Freq: Every day | ORAL | 3 refills | Status: DC

## 2016-08-15 NOTE — Telephone Encounter (Signed)
rx sent to [facility]. One pill daily.  Lets recheck potassium in 1-2 weeks.  (ordered - no need to fast)

## 2016-08-15 NOTE — Telephone Encounter (Signed)
Please advise what dose you would like sent in.

## 2016-08-16 MED ORDER — POTASSIUM CHLORIDE CRYS ER 20 MEQ PO TBCR: 20.0000 meq | EXTENDED_RELEASE_TABLET | Freq: Every day | ORAL | 1 refills | Status: DC

## 2016-08-16 NOTE — Addendum Note (Signed)
Addended by: Zenovia JordanMITCHELL, Mikiah Demond B on: 08/16/2016 09:04 AM   Modules accepted: Orders

## 2016-08-16 NOTE — Telephone Encounter (Signed)
Patient called back.  Gave her MD response.  Since script was sent to mail order pharmacy.  Patient states she will double up on what she has now and will go to the lab in 1 to 2 weeks.

## 2016-08-16 NOTE — Telephone Encounter (Signed)
LVM informing pt

## 2016-08-16 NOTE — Telephone Encounter (Signed)
Spoke with pt. She is okay with [facility]

## 2016-09-05 ENCOUNTER — Other Ambulatory Visit (INDEPENDENT_AMBULATORY_CARE_PROVIDER_SITE_OTHER): Payer: Medicare Other

## 2016-09-05 DIAGNOSIS — E876 Hypokalemia: Secondary | ICD-10-CM | POA: Diagnosis not present

## 2016-09-05 LAB — BASIC METABOLIC PANEL
BUN: 16 mg/dL (ref 6–23)
CHLORIDE: 105 meq/L (ref 96–112)
CO2: 28 meq/L (ref 19–32)
Calcium: 10.5 mg/dL (ref 8.4–10.5)
Creatinine, Ser: 0.78 mg/dL (ref 0.40–1.20)
GFR: 92.22 mL/min (ref >60.00)
Glucose, Bld: 84 mg/dL (ref 70–99)
POTASSIUM: 3.5 meq/L (ref 3.5–5.1)
SODIUM: 139 meq/L (ref 135–145)

## 2016-09-07 DIAGNOSIS — H6123 Impacted cerumen, bilateral: Secondary | ICD-10-CM | POA: Diagnosis not present

## 2016-09-21 ENCOUNTER — Telehealth: Payer: Self-pay | Admitting: *Deleted

## 2016-09-21 DIAGNOSIS — I10 Essential (primary) hypertension: Secondary | ICD-10-CM

## 2016-09-21 MED ORDER — TRIAMTERENE-HCTZ 37.5-25 MG PO CAPS: 1.0000 | ORAL_CAPSULE | Freq: Every day | ORAL | 2 refills | Status: DC

## 2016-09-21 NOTE — Telephone Encounter (Signed)
Rec'd call pt requesting refill on Triamterene/HCTZ [facility]. Sent electronically...Raechel Chute/lmb

## 2016-10-12 ENCOUNTER — Telehealth: Payer: Self-pay | Admitting: Internal Medicine

## 2016-10-12 DIAGNOSIS — R252 Cramp and spasm: Secondary | ICD-10-CM

## 2016-10-12 MED ORDER — AMLODIPINE BESYLATE 5 MG PO TABS: 5.0000 mg | ORAL_TABLET | Freq: Every day | ORAL | 1 refills | Status: DC

## 2016-10-12 NOTE — Telephone Encounter (Signed)
Patient requesting refill on amlodipine [facility].  Please follow up in regard.

## 2016-10-12 NOTE — Telephone Encounter (Signed)
Patient called to advise that she is still experiencing heavy leg cramping, and that she has been taking the potassium. She declined triage line, and asks for you advice/

## 2016-10-12 NOTE — Telephone Encounter (Signed)
Please advise 

## 2016-10-12 NOTE — Telephone Encounter (Signed)
RX sent

## 2016-10-13 NOTE — Telephone Encounter (Signed)
We can recheck her blood work and possibly increase the potassium and check her magnesium level.  (BMP, magnesium)

## 2016-10-14 NOTE — Telephone Encounter (Signed)
Labs entered,pt aware.  

## 2016-10-31 DIAGNOSIS — M5126 Other intervertebral disc displacement, lumbar region: Secondary | ICD-10-CM | POA: Diagnosis not present

## 2016-10-31 DIAGNOSIS — G8929 Other chronic pain: Secondary | ICD-10-CM | POA: Diagnosis not present

## 2016-10-31 DIAGNOSIS — M5442 Lumbago with sciatica, left side: Secondary | ICD-10-CM | POA: Diagnosis not present

## 2016-10-31 DIAGNOSIS — G2581 Restless legs syndrome: Secondary | ICD-10-CM | POA: Diagnosis not present

## 2016-10-31 DIAGNOSIS — M5416 Radiculopathy, lumbar region: Secondary | ICD-10-CM | POA: Diagnosis not present

## 2016-10-31 DIAGNOSIS — M5441 Lumbago with sciatica, right side: Secondary | ICD-10-CM | POA: Diagnosis not present

## 2016-11-03 DIAGNOSIS — H25013 Cortical age-related cataract, bilateral: Secondary | ICD-10-CM | POA: Diagnosis not present

## 2016-11-03 DIAGNOSIS — H2513 Age-related nuclear cataract, bilateral: Secondary | ICD-10-CM | POA: Diagnosis not present

## 2016-11-03 DIAGNOSIS — H401132 Primary open-angle glaucoma, bilateral, moderate stage: Secondary | ICD-10-CM | POA: Diagnosis not present

## 2016-12-19 DIAGNOSIS — M2042 Other hammer toe(s) (acquired), left foot: Secondary | ICD-10-CM | POA: Diagnosis not present

## 2016-12-19 DIAGNOSIS — M2011 Hallux valgus (acquired), right foot: Secondary | ICD-10-CM | POA: Diagnosis not present

## 2016-12-19 DIAGNOSIS — M2012 Hallux valgus (acquired), left foot: Secondary | ICD-10-CM | POA: Diagnosis not present

## 2016-12-19 DIAGNOSIS — M2041 Other hammer toe(s) (acquired), right foot: Secondary | ICD-10-CM | POA: Diagnosis not present

## 2017-01-23 ENCOUNTER — Other Ambulatory Visit: Payer: Self-pay | Admitting: Internal Medicine

## 2017-02-02 ENCOUNTER — Other Ambulatory Visit: Payer: Self-pay | Admitting: Internal Medicine

## 2017-02-08 ENCOUNTER — Encounter: Payer: Self-pay | Admitting: Internal Medicine

## 2017-02-08 ENCOUNTER — Other Ambulatory Visit (INDEPENDENT_AMBULATORY_CARE_PROVIDER_SITE_OTHER): Payer: Medicare Other

## 2017-02-08 ENCOUNTER — Ambulatory Visit (INDEPENDENT_AMBULATORY_CARE_PROVIDER_SITE_OTHER): Payer: Medicare Other | Admitting: Internal Medicine

## 2017-02-08 VITALS — BP 132/90 | HR 78 | Temp 97.5°F | Resp 16 | Ht 64.0 in | Wt 151.0 lb

## 2017-02-08 DIAGNOSIS — I10 Essential (primary) hypertension: Secondary | ICD-10-CM

## 2017-02-08 DIAGNOSIS — E876 Hypokalemia: Secondary | ICD-10-CM

## 2017-02-08 DIAGNOSIS — E2839 Other primary ovarian failure: Secondary | ICD-10-CM | POA: Diagnosis not present

## 2017-02-08 DIAGNOSIS — Z13820 Encounter for screening for osteoporosis: Secondary | ICD-10-CM

## 2017-02-08 DIAGNOSIS — E7800 Pure hypercholesterolemia, unspecified: Secondary | ICD-10-CM

## 2017-02-08 DIAGNOSIS — Z23 Encounter for immunization: Secondary | ICD-10-CM

## 2017-02-08 LAB — LIPID PANEL
CHOLESTEROL: 176 mg/dL (ref 0–200)
HDL: 56.6 mg/dL (ref >39.00)
LDL Cholesterol: 100 mg/dL — ABNORMAL HIGH (ref 0–99)
NonHDL: 119.3
Total CHOL/HDL Ratio: 3
Triglycerides: 97 mg/dL (ref 0.0–149.0)
VLDL: 19.4 mg/dL (ref 0.0–40.0)

## 2017-02-08 LAB — COMPREHENSIVE METABOLIC PANEL
ALBUMIN: 4.7 g/dL (ref 3.5–5.2)
ALK PHOS: 81 U/L (ref 39–117)
ALT: 18 U/L (ref 0–35)
AST: 23 U/L (ref 0–37)
BUN: 18 mg/dL (ref 6–23)
CALCIUM: 11.2 mg/dL — AB (ref 8.4–10.5)
CO2: 28 mEq/L (ref 19–32)
Chloride: 103 mEq/L (ref 96–112)
Creatinine, Ser: 0.79 mg/dL (ref 0.40–1.20)
GFR: 90.77 mL/min (ref >60.00)
Glucose, Bld: 77 mg/dL (ref 70–99)
POTASSIUM: 3.5 meq/L (ref 3.5–5.1)
Sodium: 140 mEq/L (ref 135–145)
TOTAL PROTEIN: 8.1 g/dL (ref 6.0–8.3)
Total Bilirubin: 0.5 mg/dL (ref 0.2–1.2)

## 2017-02-08 MED ORDER — PROBIOTIC DAILY PO CAPS: ORAL_CAPSULE | ORAL | Status: DC

## 2017-02-08 NOTE — Assessment & Plan Note (Addendum)
cmp Continue potassium supplement

## 2017-02-08 NOTE — Progress Notes (Signed)
Subjective:    Patient ID: Julie Adkins, female    DOB: Jun 19, 1941, 76 y.o.   MRN: [id number]  HPI The patient is here for follow up.  Hypertension, hypokalemia: She is taking her medication daily, but did not take it today. She is compliant with a low sodium diet.  She denies chest pain, palpitations, edema, shortness of breath and regular headaches. She is exercising regularly - golf and walking.  She does not monitor her blood pressure at home regularly, but has a cuff at home.    Hyperlipidemia: She is taking her medication daily. She is compliant with a low fat/cholesterol diet. She is exercising regularly. She denies myalgias.   She is having some left sided hip pain and will follow up with her orthopedic.   She has not had a dexa scan in about 5 years.    Medications and allergies reviewed with patient and updated if appropriate.  Patient Active Problem List   Diagnosis Date Noted  . Hypokalemia 08/11/2016  . Essential hypertension, benign 02/09/2016  . Hyperlipidemia 02/09/2016  . Sciatica of right side 02/09/2016    Current Outpatient Prescriptions on File Prior to Visit  Medication Sig Dispense Refill  . amLODipine (NORVASC) 5 MG tablet Take 1 tablet (5 mg total) by mouth daily. 90 tablet 1  . aspirin 81 MG tablet Take 81 mg by mouth daily.    . B Complex CAPS Taking daily  0  . Cholecalciferol (VITAMIN D3) 2000 units TABS Taking daily 30 tablet   . KLOR-CON M20 20 MEQ tablet TAKE 1 TABLET DAILY 90 tablet 0  . Multiple Vitamin (MULTIVITAMIN) tablet Take by mouth.    . simvastatin (ZOCOR) 20 MG tablet TAKE 1 TABLET DAILY 90 tablet 0  . triamterene-hydrochlorothiazide (DYAZIDE) 37.5-25 MG capsule Take 1 each (1 capsule total) by mouth daily. 90 capsule 2   No current facility-administered medications on file prior to visit.     Past Medical History:  Diagnosis Date  . Hyperlipidemia   . Hypertension     Past Surgical History:  Procedure Laterality Date  . NO  PAST SURGERIES      Social History   Social History  . Marital status: Widowed    Spouse name: N/A  . Number of children: 4  . Years of education: N/A   Social History Main Topics  . Smoking status: Never Smoker  . Smokeless tobacco: Never Used  . Alcohol use No  . Drug use: No  . Sexual activity: Not on file   Other Topics Concern  . Not on file   Social History Narrative   Widow, lives alone      Exercises regularly      4 children    No family history on file.  Review of Systems  Constitutional: Negative for chills and fever.  Respiratory: Negative for cough, shortness of breath and wheezing.   Cardiovascular: Negative for chest pain, palpitations and leg swelling.  Neurological: Positive for headaches (rare). Negative for light-headedness.       Objective:   Vitals:   02/08/17 1048  BP: 132/90  Pulse: 78  Resp: 16  Temp: 97.5 F (36.4 C)   Wt Readings from Last 3 Encounters:  02/08/17 151 lb (68.5 kg)  08/11/16 147 lb (66.7 kg)  05/26/16 142 lb 4 oz (64.5 kg)   Body mass index is 25.92 kg/m.   Physical Exam    Constitutional: Appears well-developed and well-nourished. No distress.  HENT:  Head: Normocephalic and atraumatic.  Neck: Neck supple. No tracheal deviation present. No thyromegaly present.  No cervical lymphadenopathy Cardiovascular: Normal rate, regular rhythm and normal heart sounds.   2/6 systolic murmur heard. No carotid bruit .  No edema Pulmonary/Chest: Effort normal and breath sounds normal. No respiratory distress. No has no wheezes. No rales.  Skin: Skin is warm and dry. Not diaphoretic.  Psychiatric: Normal mood and affect. Behavior is normal.      Assessment & Plan:   dexa ordered  See Problem List for Assessment and Plan of chronic medical problems.   FU in 6 months - wellness with nurse and fu with me

## 2017-02-08 NOTE — Addendum Note (Signed)
Addended by: Zenovia JordanMITCHELL, Aspyn Warnke B on: 02/08/2017 11:31 AM   Modules accepted: Orders

## 2017-02-08 NOTE — Progress Notes (Signed)
Pre visit review using our clinic review tool, if applicable. No additional management support is needed unless otherwise documented below in the visit note. 

## 2017-02-08 NOTE — Assessment & Plan Note (Signed)
Check lipid panel  Continue daily statin Regular exercise and healthy diet encouraged  

## 2017-02-08 NOTE — Assessment & Plan Note (Signed)
BP likely well controlled Current regimen effective and well tolerated Continue current medications at current doses cmp

## 2017-02-08 NOTE — Patient Instructions (Addendum)
  Test(s) ordered today. Your results will be released to MyChart (or called to you) after review, usually within 72hours after test completion. If any changes need to be made, you will be notified at that same time.  All other Health Maintenance issues reviewed.   All recommended immunizations and age-appropriate screenings are up-to-date or discussed.  prevnar immunization administered today.   Medications reviewed and updated.  No changes recommended at this time.     Please followup in 6 months

## 2017-03-14 ENCOUNTER — Ambulatory Visit (INDEPENDENT_AMBULATORY_CARE_PROVIDER_SITE_OTHER): Payer: Medicare Other | Admitting: Internal Medicine

## 2017-03-14 ENCOUNTER — Encounter: Payer: Self-pay | Admitting: Internal Medicine

## 2017-03-14 VITALS — BP 128/80 | HR 65 | Temp 98.0°F | Ht 64.0 in | Wt 150.0 lb

## 2017-03-14 DIAGNOSIS — E2839 Other primary ovarian failure: Secondary | ICD-10-CM

## 2017-03-14 DIAGNOSIS — Z13820 Encounter for screening for osteoporosis: Secondary | ICD-10-CM | POA: Diagnosis not present

## 2017-03-14 DIAGNOSIS — I10 Essential (primary) hypertension: Secondary | ICD-10-CM

## 2017-03-14 NOTE — Patient Instructions (Signed)
   Medications reviewed and updated.  Changes include  /  No changes recommended at this time.

## 2017-03-14 NOTE — Progress Notes (Signed)
Subjective:    Patient ID: Julie Adkins, female    DOB: 02-Feb-1941, 76 y.o.   MRN: [id number]  HPI She is here for an acute visit.   She woke up early the other morning and sat up quickly and was dizzy - everything was spinning.  Her BP just after that was 164/90.  She was very concerned - it has never been that high.  She has checked it several times since then and it has been - 134/74, 134/70, 132/73, 142/75, 134/72, 118/71, 131/66.  She denies chest pain, palpitations, sob, edema and headaches. She has not had recurrent dizziness.  She did have this once when she was younger.      Medications and allergies reviewed with patient and updated if appropriate.  Patient Active Problem List   Diagnosis Date Noted  . Hypokalemia 08/11/2016  . Essential hypertension, benign 02/09/2016  . Hyperlipidemia 02/09/2016  . Sciatica of right side 02/09/2016    Current Outpatient Prescriptions on File Prior to Visit  Medication Sig Dispense Refill  . amLODipine (NORVASC) 5 MG tablet Take 1 tablet (5 mg total) by mouth daily. 90 tablet 1  . aspirin 81 MG tablet Take 81 mg by mouth daily.    . B Complex CAPS Taking daily  0  . Cholecalciferol (VITAMIN D3) 2000 units TABS Taking daily 30 tablet   . KLOR-CON M20 20 MEQ tablet TAKE 1 TABLET DAILY 90 tablet 0  . Multiple Vitamin (MULTIVITAMIN) tablet Take by mouth.    . Probiotic Product (PROBIOTIC DAILY) CAPS Taking daily    . simvastatin (ZOCOR) 20 MG tablet TAKE 1 TABLET DAILY 90 tablet 0  . triamterene-hydrochlorothiazide (DYAZIDE) 37.5-25 MG capsule Take 1 each (1 capsule total) by mouth daily. 90 capsule 2   No current facility-administered medications on file prior to visit.     Past Medical History:  Diagnosis Date  . Hyperlipidemia   . Hypertension     Past Surgical History:  Procedure Laterality Date  . NO PAST SURGERIES      Social History   Social History  . Marital status: Widowed    Spouse name: N/A  . Number of  children: 4  . Years of education: N/A   Social History Main Topics  . Smoking status: Never Smoker  . Smokeless tobacco: Never Used  . Alcohol use No  . Drug use: No  . Sexual activity: Not on file   Other Topics Concern  . Not on file   Social History Narrative   Widow, lives alone      Exercises regularly      4 children    No family history on file.  Review of Systems  Respiratory: Negative for shortness of breath.   Cardiovascular: Negative for chest pain, palpitations and leg swelling.  Neurological: Positive for dizziness. Negative for light-headedness and headaches.       Objective:   Vitals:   03/14/17 1121  BP: 128/80  Pulse: 65  Temp: 98 F (36.7 C)   Filed Weights   03/14/17 1121  Weight: 150 lb (68 kg)   Body mass index is 25.75 kg/m.  Wt Readings from Last 3 Encounters:  03/14/17 150 lb (68 kg)  02/08/17 151 lb (68.5 kg)  08/11/16 147 lb (66.7 kg)     Physical Exam Constitutional: Appears well-developed and well-nourished. No distress.  HENT:  Head: Normocephalic and atraumatic.  Neck: Neck supple. No tracheal deviation present. No thyromegaly present.  No cervical  lymphadenopathy Cardiovascular: Normal rate, regular rhythm and normal heart sounds.   No murmur heard. No carotid bruit .  No edema Pulmonary/Chest: Effort normal and breath sounds normal. No respiratory distress. No has no wheezes. No rales.  Skin: Skin is warm and dry. Not diaphoretic.  Psychiatric: Normal mood and affect. Behavior is normal.         Assessment & Plan:   See Problem List for Assessment and Plan of chronic medical problems.

## 2017-03-14 NOTE — Progress Notes (Signed)
Pre visit review using our clinic review tool, if applicable. No additional management support is needed unless otherwise documented below in the visit note. 

## 2017-03-14 NOTE — Assessment & Plan Note (Signed)
BP overall has been controlled at home and is good here today -- she had one elevated measure after dizziness and this may have been related to anxiety Continue to monitor at home No change in medication today

## 2017-04-10 ENCOUNTER — Other Ambulatory Visit: Payer: Self-pay | Admitting: Internal Medicine

## 2017-04-10 DIAGNOSIS — I10 Essential (primary) hypertension: Secondary | ICD-10-CM

## 2017-04-10 MED ORDER — TRIAMTERENE-HCTZ 37.5-25 MG PO CAPS: 1.0000 | ORAL_CAPSULE | Freq: Every day | ORAL | 3 refills | Status: DC

## 2017-04-23 ENCOUNTER — Other Ambulatory Visit: Payer: Self-pay | Admitting: Internal Medicine

## 2017-05-01 DIAGNOSIS — M5126 Other intervertebral disc displacement, lumbar region: Secondary | ICD-10-CM | POA: Diagnosis not present

## 2017-05-01 DIAGNOSIS — M5441 Lumbago with sciatica, right side: Secondary | ICD-10-CM | POA: Diagnosis not present

## 2017-05-01 DIAGNOSIS — M5416 Radiculopathy, lumbar region: Secondary | ICD-10-CM | POA: Diagnosis not present

## 2017-05-01 DIAGNOSIS — G8929 Other chronic pain: Secondary | ICD-10-CM | POA: Diagnosis not present

## 2017-05-01 DIAGNOSIS — G2581 Restless legs syndrome: Secondary | ICD-10-CM | POA: Diagnosis not present

## 2017-05-01 DIAGNOSIS — M5442 Lumbago with sciatica, left side: Secondary | ICD-10-CM | POA: Diagnosis not present

## 2017-05-02 ENCOUNTER — Encounter (HOSPITAL_BASED_OUTPATIENT_CLINIC_OR_DEPARTMENT_OTHER): Payer: Self-pay | Admitting: Emergency Medicine

## 2017-05-02 ENCOUNTER — Emergency Department (HOSPITAL_BASED_OUTPATIENT_CLINIC_OR_DEPARTMENT_OTHER)
Admission: EM | Admit: 2017-05-02 | Discharge: 2017-05-02 | Disposition: A | Discharge status: Home / Self Care | Payer: Medicare Other | Attending: Emergency Medicine | Admitting: Emergency Medicine

## 2017-05-02 DIAGNOSIS — R42 Dizziness and giddiness: Secondary | ICD-10-CM

## 2017-05-02 DIAGNOSIS — Z79899 Other long term (current) drug therapy: Secondary | ICD-10-CM | POA: Diagnosis not present

## 2017-05-02 DIAGNOSIS — Z7982 Long term (current) use of aspirin: Secondary | ICD-10-CM | POA: Insufficient documentation

## 2017-05-02 DIAGNOSIS — R0989 Other specified symptoms and signs involving the circulatory and respiratory systems: Secondary | ICD-10-CM | POA: Diagnosis not present

## 2017-05-02 DIAGNOSIS — I10 Essential (primary) hypertension: Secondary | ICD-10-CM | POA: Insufficient documentation

## 2017-05-02 NOTE — ED Notes (Signed)
Pt discharged to home with family. NAD.  

## 2017-05-02 NOTE — ED Provider Notes (Signed)
[facility] Provider Note: Lowella DellJ. Lane Shamecca Whitebread, MD, FACEP  CSN: [id number] MRN: [id number] ARRIVAL: 05/02/17 at 0040 ROOM: [facility]   CHIEF COMPLAINT  Hypertension   HISTORY OF PRESENT ILLNESS  Julie Adkins is a 76 y.o. female who states her blood pressure was normal at her doctor's office yesterday. She woke up this morning to go to the bathroom and felt "woozy" by which she means lightheaded and off balance for about a second or two. She checked her blood pressure and it was 201/101. She checked it several more times and it continued to improve. It was noted be 175/87 on arrival here and most recently was noted to be 140/63. She is asymptomatic in the ED. She denies associated chest pain, shortness of breath, nausea, vomiting or visual changes. She had a mild headache earlier which is resolved. She acknowledges that she stopped taking her diuretic 2 days ago and is wondering if maybe she is reacting to that.    Past Medical History:  Diagnosis Date  . Hyperlipidemia   . Hypertension     Past Surgical History:  Procedure Laterality Date  . NO PAST SURGERIES      No family history on file.  Social History  Substance Use Topics  . Smoking status: Never Smoker  . Smokeless tobacco: Never Used  . Alcohol use No    Prior to Admission medications   Medication Sig Start Date End Date Taking? Authorizing Provider  amLODipine (NORVASC) 5 MG tablet TAKE 1 TABLET DAILY 04/10/17   Pincus SanesBurns, Stacy J, MD  aspirin 81 MG tablet Take 81 mg by mouth daily.    [provider]  B Complex CAPS Taking daily 02/09/16   Pincus SanesBurns, Stacy J, MD  Cholecalciferol (VITAMIN D3) 2000 units TABS Taking daily 02/09/16   Pincus SanesBurns, Stacy J, MD  KLOR-CON M20 20 MEQ tablet TAKE 1 TABLET DAILY 02/02/17   Pincus SanesBurns, Stacy J, MD  Multiple Vitamin (MULTIVITAMIN) tablet Take by mouth.    [provider]  Probiotic Product (PROBIOTIC DAILY) CAPS Taking daily 02/08/17   Pincus SanesBurns, Stacy J, MD  simvastatin  (ZOCOR) 20 MG tablet TAKE 1 TABLET DAILY 04/25/17   Pincus SanesBurns, Stacy J, MD  triamterene-hydrochlorothiazide (DYAZIDE) 37.5-25 MG capsule Take 1 each (1 capsule total) by mouth daily. 04/10/17   Pincus SanesBurns, Stacy J, MD    Allergies Penicillins   REVIEW OF SYSTEMS  Negative except as noted here or in the History of Present Illness.   PHYSICAL EXAMINATION  Initial Vital Signs Blood pressure 140/63, pulse 64, temperature 98.8 F (37.1 C), resp. rate 18, SpO2 100 %.  Examination General: Well-developed, well-nourished female in no acute distress; appearance consistent with age of record HENT: normocephalic; atraumatic Eyes: pupils equal, round and reactive to light; extraocular muscles intact; arcus senilis bilaterally Neck: supple Heart: regular rate and rhythm; no murmurs Lungs: clear to auscultation bilaterally Abdomen: soft; nondistended; nontender; bowel sounds present Extremities: No deformity; full range of motion; pulses normal Neurologic: Awake, alert and oriented; motor function intact in all extremities and symmetric; no facial droop; normal coordination and speech; negative Romberg; normal finger to nose Skin: Warm and dry Psychiatric: Normal mood and affect   RESULTS  Summary of this visit's results, reviewed by myself:   EKG Interpretation  Date/Time:    Ventricular Rate:    PR Interval:    QRS Duration:   QT Interval:    QTC Calculation:   R Axis:     Text Interpretation:  Laboratory Studies: No results found for this or any previous visit (from the past 24 hour(s)). Imaging Studies: No results found.  ED COURSE  Nursing notes and initial vitals signs, including pulse oximetry, reviewed.  Vitals:   05/02/17 0049 05/02/17 0100 05/02/17 0104  BP: (!) 175/87 (!) 158/63 140/63  Pulse: 79  64  Resp: 18    Temp: 98.8 F (37.1 C)    SpO2: 100%  100%   1:56 AM I do not believe any diagnostic testing is indicated at this time. Her blood pressure has  returned to near normal and she is asymptomatic. She was advised to contact her PCP regarding which he hypertension's she should continue.  PROCEDURES    ED DIAGNOSES     ICD-9-CM ICD-10-CM   1. Episodic lightheadedness 780.4 R42   2. Labile blood pressure 796.2 R09.89        Jesseka Drinkard, Jonny Ruiz, MD 05/02/17 0157

## 2017-05-02 NOTE — ED Triage Notes (Signed)
PT presents to ED with c/o hypertension. Pt states her BP was nml at doctor office today but she woke up tonight wto go to bathroom and felt lightheaded and off balance and check her bp and it was 201/101

## 2017-05-02 NOTE — ED Notes (Signed)
After triage finished, when patient stood up out of triage chair, patient stated she felt like she was starting to get a headache. No neurological changes noted. Dr Read DriversMolpus notified of BP and pt complaints. States pt can have tylenol for headache. Pt refused tylenol.

## 2017-05-03 ENCOUNTER — Other Ambulatory Visit: Payer: Self-pay | Admitting: Internal Medicine

## 2017-05-03 DIAGNOSIS — H401132 Primary open-angle glaucoma, bilateral, moderate stage: Secondary | ICD-10-CM | POA: Diagnosis not present

## 2017-05-05 DIAGNOSIS — I10 Essential (primary) hypertension: Secondary | ICD-10-CM | POA: Diagnosis not present

## 2017-05-05 DIAGNOSIS — Z9119 Patient's noncompliance with other medical treatment and regimen: Secondary | ICD-10-CM | POA: Diagnosis not present

## 2017-05-15 ENCOUNTER — Ambulatory Visit (INDEPENDENT_AMBULATORY_CARE_PROVIDER_SITE_OTHER)
Admission: RE | Admit: 2017-05-15 | Discharge: 2017-05-15 | Disposition: A | Discharge status: Home / Self Care | Payer: Medicare Other | Source: Ambulatory Visit | Attending: Internal Medicine | Admitting: Internal Medicine

## 2017-05-15 DIAGNOSIS — Z13820 Encounter for screening for osteoporosis: Secondary | ICD-10-CM

## 2017-05-15 DIAGNOSIS — E2839 Other primary ovarian failure: Secondary | ICD-10-CM | POA: Diagnosis not present

## 2017-05-21 ENCOUNTER — Encounter: Payer: Self-pay | Admitting: Internal Medicine

## 2017-05-21 DIAGNOSIS — M8580 Other specified disorders of bone density and structure, unspecified site: Secondary | ICD-10-CM | POA: Insufficient documentation

## 2017-08-11 ENCOUNTER — Ambulatory Visit: Payer: Medicare Other | Admitting: Internal Medicine

## 2017-08-11 ENCOUNTER — Ambulatory Visit: Payer: Medicare Other

## 2017-08-18 ENCOUNTER — Ambulatory Visit: Payer: Medicare Other | Admitting: Internal Medicine

## 2017-08-27 NOTE — Progress Notes (Signed)
Subjective:    Patient ID: Julie Adkins, female    DOB: Apr 04, 1941, 76 y.o.   MRN: [id number]  HPI The patient is here for follow up.  Hypertension: She is taking her medication daily. She is compliant with a low sodium diet.  She denies chest pain, palpitations, edema, shortness of breath and regular headaches. She is exercising regularly.  She does not monitor her blood pressure at home.    Hyperlipidemia: She is taking her medication daily. She is compliant with a low fat/cholesterol diet. She is exercising regularly. She denies myalgias.   Hypokalemia:  She is taking her potassium daily.    Osteopenia:  She is taking her calcium and vitamin D daily.  She is exercising.  Her dexa is up to date.    Medications and allergies reviewed with patient and updated if appropriate.  Patient Active Problem List   Diagnosis Date Noted  . Osteopenia 05/21/2017  . Hypokalemia 08/11/2016  . Essential hypertension, benign 02/09/2016  . Hyperlipidemia 02/09/2016  . Sciatica of right side 02/09/2016    Current Outpatient Prescriptions on File Prior to Visit  Medication Sig Dispense Refill  . amLODipine (NORVASC) 5 MG tablet TAKE 1 TABLET DAILY 90 tablet 3  . aspirin 81 MG tablet Take 81 mg by mouth daily.    . B Complex CAPS Taking daily  0  . Cholecalciferol (VITAMIN D3) 2000 units TABS Taking daily 30 tablet   . KLOR-CON M20 20 MEQ tablet TAKE 1 TABLET DAILY 90 tablet 1  . Multiple Vitamin (MULTIVITAMIN) tablet Take by mouth.    . Probiotic Product (PROBIOTIC DAILY) CAPS Taking daily    . simvastatin (ZOCOR) 20 MG tablet TAKE 1 TABLET DAILY 90 tablet 3  . triamterene-hydrochlorothiazide (DYAZIDE) 37.5-25 MG capsule Take 1 each (1 capsule total) by mouth daily. 90 capsule 3   No current facility-administered medications on file prior to visit.     Past Medical History:  Diagnosis Date  . Hyperlipidemia   . Hypertension     Past Surgical History:  Procedure Laterality Date  . NO  PAST SURGERIES      Social History   Social History  . Marital status: Widowed    Spouse name: N/A  . Number of children: 4  . Years of education: N/A   Social History Main Topics  . Smoking status: Never Smoker  . Smokeless tobacco: Never Used  . Alcohol use No  . Drug use: No  . Sexual activity: Not Asked   Other Topics Concern  . None   Social History Narrative   Widow, lives alone      Exercises regularly      4 children    History reviewed. No pertinent family history.  Review of Systems  Constitutional: Negative for chills and fever.  Respiratory: Negative for cough, shortness of breath and wheezing.   Cardiovascular: Negative for chest pain, palpitations and leg swelling.  Neurological: Positive for headaches (occasional). Negative for light-headedness.       Objective:   Vitals:   08/28/17 1011  BP: 132/72  Pulse: (!) 55  Resp: 20  Temp: (!) 97.5 F (36.4 C)  SpO2: 98%   Wt Readings from Last 3 Encounters:  08/28/17 149 lb (67.6 kg)  03/14/17 150 lb (68 kg)  02/08/17 151 lb (68.5 kg)   Body mass index is 25.58 kg/m.   Physical Exam    Constitutional: Appears well-developed and well-nourished. No distress.  HENT:  Head: Normocephalic  and atraumatic.  Neck: Neck supple. No tracheal deviation present. No thyromegaly present.  No cervical lymphadenopathy Cardiovascular: Normal rate, regular rhythm and normal heart sounds.   No murmur heard. No carotid bruit .  No edema Pulmonary/Chest: Effort normal and breath sounds normal. No respiratory distress. No has no wheezes. No rales.  Skin: Skin is warm and dry. Not diaphoretic.  Psychiatric: Normal mood and affect. Behavior is normal.      Assessment & Plan:    See Problem List for Assessment and Plan of chronic medical problems.

## 2017-08-28 ENCOUNTER — Other Ambulatory Visit (INDEPENDENT_AMBULATORY_CARE_PROVIDER_SITE_OTHER): Payer: Medicare Other

## 2017-08-28 ENCOUNTER — Ambulatory Visit (INDEPENDENT_AMBULATORY_CARE_PROVIDER_SITE_OTHER): Payer: Medicare Other | Admitting: Internal Medicine

## 2017-08-28 ENCOUNTER — Encounter: Payer: Self-pay | Admitting: Internal Medicine

## 2017-08-28 VITALS — BP 132/72 | HR 55 | Temp 97.5°F | Resp 20 | Ht 64.0 in | Wt 149.0 lb

## 2017-08-28 DIAGNOSIS — Z0000 Encounter for general adult medical examination without abnormal findings: Secondary | ICD-10-CM

## 2017-08-28 DIAGNOSIS — E876 Hypokalemia: Secondary | ICD-10-CM

## 2017-08-28 DIAGNOSIS — M85851 Other specified disorders of bone density and structure, right thigh: Secondary | ICD-10-CM | POA: Diagnosis not present

## 2017-08-28 DIAGNOSIS — I10 Essential (primary) hypertension: Secondary | ICD-10-CM

## 2017-08-28 DIAGNOSIS — Z23 Encounter for immunization: Secondary | ICD-10-CM | POA: Diagnosis not present

## 2017-08-28 DIAGNOSIS — E7800 Pure hypercholesterolemia, unspecified: Secondary | ICD-10-CM | POA: Diagnosis not present

## 2017-08-28 DIAGNOSIS — M85852 Other specified disorders of bone density and structure, left thigh: Secondary | ICD-10-CM

## 2017-08-28 LAB — COMPREHENSIVE METABOLIC PANEL
ALBUMIN: 4.5 g/dL (ref 3.5–5.2)
ALK PHOS: 71 U/L (ref 39–117)
ALT: 15 U/L (ref 0–35)
AST: 23 U/L (ref 0–37)
BILIRUBIN TOTAL: 0.6 mg/dL (ref 0.2–1.2)
BUN: 16 mg/dL (ref 6–23)
CO2: 28 mEq/L (ref 19–32)
Calcium: 10.9 mg/dL — ABNORMAL HIGH (ref 8.4–10.5)
Chloride: 101 mEq/L (ref 96–112)
Creatinine, Ser: 0.78 mg/dL (ref 0.40–1.20)
GFR: 91.98 mL/min (ref >60.00)
GLUCOSE: 88 mg/dL (ref 70–99)
POTASSIUM: 3.9 meq/L (ref 3.5–5.1)
SODIUM: 138 meq/L (ref 135–145)
TOTAL PROTEIN: 7.6 g/dL (ref 6.0–8.3)

## 2017-08-28 NOTE — Assessment & Plan Note (Signed)
Exercising regularly DEXA up-to-date Continue calcium and vitamin D

## 2017-08-28 NOTE — Progress Notes (Signed)
Subjective:   Julie Adkins is a 76 y.o. female who presents for an Initial Medicare Annual Wellness Visit.  Review of Systems    No ROS.  Medicare Wellness Visit. Additional risk factors are reflected in the social history.     Sleep patterns: feels rested on waking, gets up 1 times nightly to void and sleeps 7-8 hours nightly.    Home Safety/Smoke Alarms: Feels safe in home. Smoke alarms in place.  Living environment; residence and Firearm Safety: 1-story house/ trailer, no firearms. Seat Belt Safety/Bike Helmet: Wears seat belt.    Objective:    There were no vitals filed for this visit. There is no height or weight on file to calculate BMI.   Current Medications (verified) Outpatient Encounter Prescriptions as of 08/28/2017  Medication Sig  . amLODipine (NORVASC) 5 MG tablet TAKE 1 TABLET DAILY  . aspirin 81 MG tablet Take 81 mg by mouth daily.  . B Complex CAPS Taking daily  . Cholecalciferol (VITAMIN D3) 2000 units TABS Taking daily  . KLOR-CON M20 20 MEQ tablet TAKE 1 TABLET DAILY  . Multiple Vitamin (MULTIVITAMIN) tablet Take by mouth.  . Probiotic Product (PROBIOTIC DAILY) CAPS Taking daily  . simvastatin (ZOCOR) 20 MG tablet TAKE 1 TABLET DAILY  . triamterene-hydrochlorothiazide (DYAZIDE) 37.5-25 MG capsule Take 1 each (1 capsule total) by mouth daily.   No facility-administered encounter medications on file as of 08/28/2017.     Allergies (verified) Penicillins   History: Past Medical History:  Diagnosis Date  . Hyperlipidemia   . Hypertension    Past Surgical History:  Procedure Laterality Date  . NO PAST SURGERIES     No family history on file. Social History   Occupational History  . Not on file.   Social History Main Topics  . Smoking status: Never Smoker  . Smokeless tobacco: Never Used  . Alcohol use No  . Drug use: No  . Sexual activity: Not on file    Tobacco Counseling Counseling given: Not Answered   Activities of Daily  Living No flowsheet data found.  Immunizations and Health Maintenance Immunization History  Administered Date(s) Administered  . Pneumococcal Conjugate-13 02/08/2017  . Pneumococcal Polysaccharide-23 06/28/2013   Health Maintenance Due  Topic Date Due  . TETANUS/TDAP  03/04/1959  . INFLUENZA VACCINE  06/28/2017    Patient Care Team: Pincus Sanes, MD as PCP - General (Internal Medicine)  Indicate any recent Medical Services you may have received from other than [facility] providers in the past year (date may be approximate).     Assessment:   This is a routine wellness examination for Julie Adkins. Physical assessment deferred to PCP.   Hearing/Vision screen No exam data present  Dietary issues and exercise activities discussed:   Diet (meal preparation, eat out, water intake, caffeinated beverages, dairy products, fruits and vegetables): in general, a "healthy" diet  , well balanced, on average, 3 meals per day, low salt      Goals    None     Depression Screen PHQ 2/9 Scores 02/08/2017  PHQ - 2 Score 0    Fall Risk Fall Risk  02/08/2017  Falls in the past year? No    Cognitive Function:        Screening Tests Health Maintenance  Topic Date Due  . TETANUS/TDAP  03/04/1959  . INFLUENZA VACCINE  06/28/2017  . DEXA SCAN  05/16/2019  . PNA vac Low Risk Adult  Completed      Plan:  I have personally reviewed and noted the following in the patient's chart:   . Medical and social history . Use of alcohol, tobacco or illicit drugs  . Current medications and supplements . Functional ability and status . Nutritional status . Physical activity . Advanced directives . List of other physicians . Vitals . Screenings to include cognitive, depression, and falls . Referrals and appointments  In addition, I have reviewed and discussed with patient certain preventive protocols, quality metrics, and best practice recommendations. A written personalized care plan for  preventive services as well as general preventive health recommendations were provided to patient.     Wanda Plump, RN   08/28/2017    Medical screening examination/treatment/procedure(s) were performed by non-physician practitioner and as supervising physician I was immediately available for consultation/collaboration. I agree with above. Pincus Sanes, MD

## 2017-08-28 NOTE — Progress Notes (Signed)
Pre visit review using our clinic review tool, if applicable. No additional management support is needed unless otherwise documented below in the visit note. 

## 2017-08-28 NOTE — Assessment & Plan Note (Signed)
Taking potassium  cmp today 

## 2017-08-28 NOTE — Assessment & Plan Note (Signed)
Cholesterol has been well controlled so we will not checked today Continue simvastatin 20 mg daily

## 2017-08-28 NOTE — Assessment & Plan Note (Signed)
BP well controlled Current regimen effective and well tolerated Continue current medications at current doses cmp  

## 2017-08-28 NOTE — Patient Instructions (Addendum)
Continue doing brain stimulating activities (puzzles, reading, adult coloring books, staying active) to keep memory sharp.   Continue to eat heart healthy diet (full of fruits, vegetables, whole grains, lean protein, water--limit salt, fat, and sugar intake) and increase physical activity as tolerated.   Julie Adkins , Thank you for taking time to come for your Medicare Wellness Visit. I appreciate your ongoing commitment to your health goals. Please review the following plan we discussed and let me know if I can assist you in the future.   These are the goals we discussed: Goals    . I want to continue to play golf, enjoy life, family, and stay active volunteering in my church       This is a list of the screening recommended for you and due dates:  Health Maintenance  Topic Date Due  . Tetanus Vaccine  03/04/1960  . Flu Shot  06/28/2017  . DEXA scan (bone density measurement)  05/16/2019  . Pneumonia vaccines  Completed   Influenza Virus Vaccine injection What is this medicine? INFLUENZA VIRUS VACCINE (in floo EN zuh VAHY ruhs vak SEEN) helps to reduce the risk of getting influenza also known as the flu. The vaccine only helps protect you against some strains of the flu. This medicine may be used for other purposes; ask your health care provider or pharmacist if you have questions. COMMON BRAND NAME(S): Afluria, Agriflu, Alfuria, FLUAD, Fluarix, Fluarix Quadrivalent, Flublok, Flublok Quadrivalent, FLUCELVAX, Flulaval, Fluvirin, Fluzone, Fluzone High-Dose, Fluzone Intradermal What should I tell my health care provider before I take this medicine? They need to know if you have any of these conditions: -bleeding disorder like hemophilia -fever or infection -Guillain-Barre syndrome or other neurological problems -immune system problems -infection with the human immunodeficiency virus (HIV) or AIDS -low blood platelet counts -multiple sclerosis -an unusual or allergic reaction to  influenza virus vaccine, latex, other medicines, foods, dyes, or preservatives. Different brands of vaccines contain different allergens. Some may contain latex or eggs. Talk to your doctor about your allergies to make sure that you get the right vaccine. -pregnant or trying to get pregnant -breast-feeding How should I use this medicine? This vaccine is for injection into a muscle or under the skin. It is given by a health care professional. A copy of Vaccine Information Statements will be given before each vaccination. Read this sheet carefully each time. The sheet may change frequently. Talk to your healthcare provider to see which vaccines are right for you. Some vaccines should not be used in all age groups. Overdosage: If you think you have taken too much of this medicine contact a poison control center or emergency room at once. NOTE: This medicine is only for you. Do not share this medicine with others. What if I miss a dose? This does not apply. What may interact with this medicine? -chemotherapy or radiation therapy -medicines that lower your immune system like etanercept, anakinra, infliximab, and adalimumab -medicines that treat or prevent blood clots like warfarin -phenytoin -steroid medicines like prednisone or cortisone -theophylline -vaccines This list may not describe all possible interactions. Give your health care provider a list of all the medicines, herbs, non-prescription drugs, or dietary supplements you use. Also tell them if you smoke, drink alcohol, or use illegal drugs. Some items may interact with your medicine. What should I watch for while using this medicine? Report any side effects that do not go away within 3 days to your doctor or health care professional. Call your  health care provider if any unusual symptoms occur within 6 weeks of receiving this vaccine. You may still catch the flu, but the illness is not usually as bad. You cannot get the flu from the  vaccine. The vaccine will not protect against colds or other illnesses that may cause fever. The vaccine is needed every year. What side effects may I notice from receiving this medicine? Side effects that you should report to your doctor or health care professional as soon as possible: -allergic reactions like skin rash, itching or hives, swelling of the face, lips, or tongue Side effects that usually do not require medical attention (report to your doctor or health care professional if they continue or are bothersome): -fever -headache -muscle aches and pains -pain, tenderness, redness, or swelling at the injection site -tiredness This list may not describe all possible side effects. Call your doctor for medical advice about side effects. You may report side effects to FDA at 1-800-FDA-1088. Where should I keep my medicine? The vaccine will be given by a health care professional in a clinic, pharmacy, doctor's office, or other health care setting. You will not be given vaccine doses to store at home. NOTE: This sheet is a summary. It may not cover all possible information. If you have questions about this medicine, talk to your doctor, pharmacist, or health care provider.  2018 Elsevier/Gold Standard (2015-06-05 10:07:28)

## 2017-09-13 DIAGNOSIS — Z1231 Encounter for screening mammogram for malignant neoplasm of breast: Secondary | ICD-10-CM | POA: Diagnosis not present

## 2017-09-13 LAB — HM MAMMOGRAPHY

## 2017-09-19 ENCOUNTER — Encounter: Payer: Self-pay | Admitting: Internal Medicine

## 2017-10-10 DIAGNOSIS — M5442 Lumbago with sciatica, left side: Secondary | ICD-10-CM | POA: Diagnosis not present

## 2017-10-10 DIAGNOSIS — G2581 Restless legs syndrome: Secondary | ICD-10-CM | POA: Diagnosis not present

## 2017-10-10 DIAGNOSIS — M5126 Other intervertebral disc displacement, lumbar region: Secondary | ICD-10-CM | POA: Diagnosis not present

## 2017-10-10 DIAGNOSIS — M5441 Lumbago with sciatica, right side: Secondary | ICD-10-CM | POA: Diagnosis not present

## 2017-12-08 ENCOUNTER — Telehealth: Payer: Self-pay | Admitting: Internal Medicine

## 2017-12-08 DIAGNOSIS — M79674 Pain in right toe(s): Secondary | ICD-10-CM

## 2017-12-08 NOTE — Telephone Encounter (Signed)
LV for pt to call back, need to know why she needs to see podiatry. Foor problem is not detailed enough

## 2017-12-08 NOTE — Telephone Encounter (Signed)
Copied from CRM [id number]. Topic: Referral - Request >> Dec 08, 2017 10:27 AM Landry MellowFoltz, Melissa J wrote: Reason for CRM:  Pt would like referral to podiatrist for foot problem she is having.   Cb # is [phone]

## 2017-12-11 NOTE — Telephone Encounter (Signed)
Copied from CRM [id number]. Topic: Referral - Request >> Dec 08, 2017 10:27 AM Landry MellowFoltz, Melissa J wrote: Reason for CRM:  Pt would like referral to podiatrist for foot problem she is having.   Cb # is [phone]  >> Dec 11, 2017 11:22 AM Elliot GaultBell, Tiffany M wrote:   Relation to pt: self  Call back number: [phone]   Reason for call:   Patient returned call and requesting referral [place] [facility] due to right index toe pain, patient requesting referral placed as soon as possible due to symptoms, please advise

## 2017-12-19 DIAGNOSIS — M2041 Other hammer toe(s) (acquired), right foot: Secondary | ICD-10-CM | POA: Diagnosis not present

## 2017-12-19 DIAGNOSIS — M2011 Hallux valgus (acquired), right foot: Secondary | ICD-10-CM | POA: Diagnosis not present

## 2018-01-01 DIAGNOSIS — H401132 Primary open-angle glaucoma, bilateral, moderate stage: Secondary | ICD-10-CM | POA: Diagnosis not present

## 2018-01-01 DIAGNOSIS — H25013 Cortical age-related cataract, bilateral: Secondary | ICD-10-CM | POA: Diagnosis not present

## 2018-01-01 DIAGNOSIS — H2513 Age-related nuclear cataract, bilateral: Secondary | ICD-10-CM | POA: Diagnosis not present

## 2018-01-02 ENCOUNTER — Other Ambulatory Visit: Payer: Self-pay | Admitting: Internal Medicine

## 2018-01-04 ENCOUNTER — Telehealth: Payer: Self-pay | Admitting: Emergency Medicine

## 2018-01-04 NOTE — Telephone Encounter (Signed)
LVM informing pt

## 2018-01-04 NOTE — Telephone Encounter (Signed)
Yes, she can stop - she no longer needs them

## 2018-01-04 NOTE — Telephone Encounter (Signed)
Copied from CRM [id number]. Topic: General - Other >> Jan 04, 2018 10:13 AM Elliot GaultBell, Tiffany M wrote:  Relation to pt: self  Call back number: [phone] (H)   Reason for call:  Patient states she will turning 77 years old and states she never had abnormal pap an would like to know when can she stop having PAP, please advise >> Jan 04, 2018 10:14 AM Elliot GaultBell, Tiffany M wrote:  Relation to pt: self  Call back number: [phone] (H)   Reason for call:  Patient states she will turning 77 years old and states she never had abnormal pap an would like to know when can she stop having PAP, please advise

## 2018-02-27 NOTE — Progress Notes (Signed)
Subjective:    Patient ID: Virgilio Julie Adkins, female    DOB: 11/11/1941, 77 y.o.   MRN: [id number]  HPI The patient is here for follow up.  Ears ringing:  She has had it for years, but it has gotten worse.  He balance is off at times.  She denies hearing loss.  Leg cramps:  She has leg cramps at night.  Elevating her leg seems to prevent them.  She denies swelling. It is always in the left leg.  She is unsure if she drinks enough water during the day.  She has sciatica at times on right side.  She sees neurosurgery twice a year.  She currently she does not have any sciatica symptoms and has never had any sciatica in the left leg.  Hypertension, hypokalemia: She is taking her medication daily. She is compliant with a low sodium diet.  She denies chest pain, palpitations, edema, shortness of breath and regular headaches. She is exercising regularly.  She does monitor her blood pressure at home - 120/70.    Hyperlipidemia: She is taking her medication daily. She is compliant with a low fat/cholesterol diet. She is exercising regularly. She denies myalgias.   Osteopenia:  Her last dexa was 2018.  She is taking her calcium and vitamin d daily.  She is exercising regularly  Medications and allergies reviewed with patient and updated if appropriate.  Patient Active Problem List   Diagnosis Date Noted  . Osteopenia 05/21/2017  . Hypokalemia 08/11/2016  . Essential hypertension, benign 02/09/2016  . Hyperlipidemia 02/09/2016  . Sciatica of right side 02/09/2016    Current Outpatient Medications on File Prior to Visit  Medication Sig Dispense Refill  . amLODipine (NORVASC) 5 MG tablet TAKE 1 TABLET DAILY 90 tablet 3  . aspirin 81 MG tablet Take 81 mg by mouth daily.    . B Complex CAPS Taking daily  0  . Cholecalciferol (VITAMIN D3) 2000 units TABS Taking daily 30 tablet   . KLOR-CON M20 20 MEQ tablet TAKE 1 TABLET DAILY 90 tablet 1  . Multiple Vitamin (MULTIVITAMIN) tablet Take by mouth.      . Probiotic Product (PROBIOTIC DAILY) CAPS Taking daily    . simvastatin (ZOCOR) 20 MG tablet TAKE 1 TABLET DAILY 90 tablet 3  . traMADol (ULTRAM) 50 MG tablet Take 50 mg by mouth.    . triamterene-hydrochlorothiazide (DYAZIDE) 37.5-25 MG capsule Take 1 each (1 capsule total) by mouth daily. 90 capsule 3   No current facility-administered medications on file prior to visit.     Past Medical History:  Diagnosis Date  . Hyperlipidemia   . Hypertension     Past Surgical History:  Procedure Laterality Date  . NO PAST SURGERIES      Social History   Socioeconomic History  . Marital status: Widowed    Spouse name: Not on file  . Number of children: 4  . Years of education: Not on file  . Highest education level: Not on file  Occupational History  . Not on file  Social Needs  . Financial resource strain: Not on file  . Food insecurity:    Worry: Not on file    Inability: Not on file  . Transportation needs:    Medical: Not on file    Non-medical: Not on file  Tobacco Use  . Smoking status: Never Smoker  . Smokeless tobacco: Never Used  Substance and Sexual Activity  . Alcohol use: No  . Drug  use: No  . Sexual activity: Not on file  Lifestyle  . Physical activity:    Days per week: Not on file    Minutes per session: Not on file  . Stress: Not on file  Relationships  . Social connections:    Talks on phone: Not on file    Gets together: Not on file    Attends religious service: Not on file    Active member of club or organization: Not on file    Attends meetings of clubs or organizations: Not on file    Relationship status: Not on file  Other Topics Concern  . Not on file  Social History Narrative   Widow, lives alone      Exercises regularly      4 children    No family history on file.  Review of Systems  Constitutional: Negative for chills and fever.  HENT: Negative for ear pain and hearing loss.   Respiratory: Negative for cough, shortness of  breath and wheezing.   Cardiovascular: Negative for chest pain, palpitations and leg swelling.  Musculoskeletal:       Leg cramping - left leg only  Neurological: Negative for dizziness, light-headedness and headaches.       Poor balance at times       Objective:   Vitals:   02/28/18 0934  BP: (!) 144/84  Pulse: 70  Resp: 16  Temp: 98.1 F (36.7 C)  SpO2: 94%   BP Readings from Last 3 Encounters:  02/28/18 (!) 144/84  08/28/17 132/72  05/02/17 (!) 158/74   Wt Readings from Last 3 Encounters:  02/28/18 154 lb (69.9 kg)  08/28/17 149 lb (67.6 kg)  03/14/17 150 lb (68 kg)   Body mass index is 26.43 kg/m.   Physical Exam    Constitutional: Appears well-developed and well-nourished. No distress.  HENT:  Head: Normocephalic and atraumatic.  Neck: Neck supple. No tracheal deviation present. No thyromegaly present.  No cervical lymphadenopathy Cardiovascular: Normal rate, regular rhythm and normal heart sounds.   No murmur heard. No carotid bruit .  No edema Pulmonary/Chest: Effort normal and breath sounds normal. No respiratory distress. No has no wheezes. No rales.  Skin: Skin is warm and dry. Not diaphoretic.  Psychiatric: Normal mood and affect. Behavior is normal.      Assessment & Plan:    See Problem List for Assessment and Plan of chronic medical problems.

## 2018-02-27 NOTE — Patient Instructions (Addendum)
    Medications reviewed and updated.  No changes recommended at this time.   A referral was ordered for ENT  Please followup in 6 months   

## 2018-02-28 ENCOUNTER — Encounter: Payer: Self-pay | Admitting: Internal Medicine

## 2018-02-28 ENCOUNTER — Ambulatory Visit (INDEPENDENT_AMBULATORY_CARE_PROVIDER_SITE_OTHER): Payer: Medicare Other | Admitting: Internal Medicine

## 2018-02-28 VITALS — BP 144/84 | HR 70 | Temp 98.1°F | Resp 16 | Wt 154.0 lb

## 2018-02-28 DIAGNOSIS — M85851 Other specified disorders of bone density and structure, right thigh: Secondary | ICD-10-CM

## 2018-02-28 DIAGNOSIS — R252 Cramp and spasm: Secondary | ICD-10-CM | POA: Diagnosis not present

## 2018-02-28 DIAGNOSIS — I10 Essential (primary) hypertension: Secondary | ICD-10-CM

## 2018-02-28 DIAGNOSIS — M85852 Other specified disorders of bone density and structure, left thigh: Secondary | ICD-10-CM

## 2018-02-28 DIAGNOSIS — H9313 Tinnitus, bilateral: Secondary | ICD-10-CM | POA: Diagnosis not present

## 2018-02-28 DIAGNOSIS — E7849 Other hyperlipidemia: Secondary | ICD-10-CM

## 2018-02-28 DIAGNOSIS — E876 Hypokalemia: Secondary | ICD-10-CM

## 2018-02-28 NOTE — Assessment & Plan Note (Signed)
Continue statin Continue regular exercise and heart healthy diet

## 2018-02-28 NOTE — Assessment & Plan Note (Signed)
Continue potassium daily-prior to taking the prescription potassium she was taking over-the-counter Potassium has been stable since taking the prescription potassium so we will hold off on blood work per her request Recheck CMP in 6 months

## 2018-02-28 NOTE — Assessment & Plan Note (Signed)
Has gotten much worse and she would like to be referred to ENT We will also have them evaluate her being off balance which she feels may be related to the ears-if this is not an ear issue she may need to see neurology

## 2018-02-28 NOTE — Assessment & Plan Note (Signed)
She is experiencing left leg cramping at night only.  Elevating her legs helps. Given that it is only the left leg and may be secondary to her back.  She has had sciatica in the past going down the right leg, but not the left leg She deferred blood work at this time We will continue to elevate legs at night We will discuss with neurosurgery to see if they feel it is something coming from her back

## 2018-02-28 NOTE — Assessment & Plan Note (Signed)
DEXA up-to-date Continue calcium and vitamin D Continue regular exercise

## 2018-02-28 NOTE — Assessment & Plan Note (Signed)
BP well controlled Current regimen effective and well tolerated Continue current medications at current doses We will hold off on blood work per her request

## 2018-03-08 DIAGNOSIS — Z01419 Encounter for gynecological examination (general) (routine) without abnormal findings: Secondary | ICD-10-CM | POA: Diagnosis not present

## 2018-04-02 DIAGNOSIS — H9313 Tinnitus, bilateral: Secondary | ICD-10-CM | POA: Diagnosis not present

## 2018-04-02 DIAGNOSIS — H938X1 Other specified disorders of right ear: Secondary | ICD-10-CM | POA: Diagnosis not present

## 2018-04-02 DIAGNOSIS — H6122 Impacted cerumen, left ear: Secondary | ICD-10-CM | POA: Diagnosis not present

## 2018-04-02 DIAGNOSIS — H903 Sensorineural hearing loss, bilateral: Secondary | ICD-10-CM | POA: Diagnosis not present

## 2018-04-02 DIAGNOSIS — Z7289 Other problems related to lifestyle: Secondary | ICD-10-CM | POA: Diagnosis not present

## 2018-04-05 DIAGNOSIS — R0789 Other chest pain: Secondary | ICD-10-CM | POA: Diagnosis not present

## 2018-04-05 DIAGNOSIS — I4510 Unspecified right bundle-branch block: Secondary | ICD-10-CM | POA: Diagnosis not present

## 2018-04-05 DIAGNOSIS — R079 Chest pain, unspecified: Secondary | ICD-10-CM | POA: Diagnosis not present

## 2018-04-06 ENCOUNTER — Other Ambulatory Visit: Payer: Self-pay | Admitting: Internal Medicine

## 2018-04-06 DIAGNOSIS — I4510 Unspecified right bundle-branch block: Secondary | ICD-10-CM | POA: Diagnosis not present

## 2018-04-09 ENCOUNTER — Other Ambulatory Visit: Payer: Self-pay | Admitting: Internal Medicine

## 2018-04-09 NOTE — Telephone Encounter (Signed)
Copied from CRM [id number]. Topic: Quick Communication - Rx Refill/Question >> Apr 09, 2018  1:40 PM Zada Girt, Lumin L wrote: Medication: Would like something prescribed for motion sickness. Going on a cruise this weekend. Has the patient contacted their pharmacy? Yes.   (Agent: If no, request that the patient contact the pharmacy for the refill.) Preferred Pharmacy (with phone number or street name): [id number] - [place], [place] - [address] AT [facility] [address] [place]  [address] Phone: [phone] Fax: [phone] Agent: Please be advised that RX refills may take up to 3 business days. We ask that you follow-up with your pharmacy.

## 2018-04-10 MED ORDER — SCOPOLAMINE 1 MG/3DAYS TD PT72: 1.0000 | MEDICATED_PATCH | TRANSDERMAL | 0 refills | Status: DC

## 2018-04-10 NOTE — Telephone Encounter (Signed)
Patient is requesting a medication for motion sickness.

## 2018-05-02 DIAGNOSIS — R21 Rash and other nonspecific skin eruption: Secondary | ICD-10-CM | POA: Diagnosis not present

## 2018-05-28 ENCOUNTER — Other Ambulatory Visit: Payer: Self-pay | Admitting: Internal Medicine

## 2018-05-28 DIAGNOSIS — I10 Essential (primary) hypertension: Secondary | ICD-10-CM

## 2018-07-01 ENCOUNTER — Other Ambulatory Visit: Payer: Self-pay | Admitting: Internal Medicine

## 2018-07-12 DIAGNOSIS — H401132 Primary open-angle glaucoma, bilateral, moderate stage: Secondary | ICD-10-CM | POA: Diagnosis not present

## 2018-08-14 ENCOUNTER — Telehealth: Payer: Self-pay | Admitting: Internal Medicine

## 2018-08-14 NOTE — Telephone Encounter (Signed)
Because of the triamterene and hydrochlorothiazide she needs something stronger than what is over-the-counter.  New prescription for potassium pending-send to desired pharmacy.

## 2018-08-14 NOTE — Telephone Encounter (Signed)
Copied from CRM [id number]. Topic: Quick Communication - Rx Refill/Question >> Aug 14, 2018  1:18 PM Angela NevinWilliams, Candice N wrote: Medication: KLOR-CON M20 20 MEQ tablet   Pt states that this medication is no longer carried by [facility]. Pt would like to know if it was okay to discontinue it and start using an OTC potassium gluconate 99mg  instead. Pt states she is open to another RX if needed. Please advise.

## 2018-08-15 NOTE — Telephone Encounter (Signed)
LVM for pt to call back in regards.  

## 2018-08-16 MED ORDER — POTASSIUM CHLORIDE CRYS ER 20 MEQ PO TBCR: 20.0000 meq | EXTENDED_RELEASE_TABLET | Freq: Every day | ORAL | 3 refills | Status: DC

## 2018-08-16 NOTE — Telephone Encounter (Signed)
Pt aware of response. [facility] per pts request.

## 2018-08-30 ENCOUNTER — Ambulatory Visit: Payer: Medicare Other | Admitting: Internal Medicine

## 2018-09-11 NOTE — Progress Notes (Signed)
Subjective:    Patient ID: Julie Adkins, female    DOB: Nov 17, 1941, 77 y.o.   MRN: [id number]  HPI The patient is here for follow up.  Hypertension, hypokalemia: She is taking her medication daily. She is compliant with a low sodium diet.  She denies chest pain, palpitations, edema, shortness of breath and regular headaches. She is exercising regularly - 5 days a week.     Hyperlipidemia: She is taking her medication daily. She is compliant with a low fat/cholesterol diet. She is exercising regularly. She denies myalgias.   Osteopenia:  Her last dexa was 2018.  She is exercising and taking vitamin d daily.    Medications and allergies reviewed with patient and updated if appropriate.  Patient Active Problem List   Diagnosis Date Noted  . Tinnitus of both ears 02/28/2018  . Leg cramping 02/28/2018  . Osteopenia 05/21/2017  . Hypokalemia 08/11/2016  . Essential hypertension, benign 02/09/2016  . Hyperlipidemia 02/09/2016  . Sciatica of right side 02/09/2016    Current Outpatient Medications on File Prior to Visit  Medication Sig Dispense Refill  . amLODipine (NORVASC) 5 MG tablet TAKE 1 TABLET DAILY 90 tablet 1  . aspirin 81 MG tablet Take 81 mg by mouth daily.    . B Complex CAPS Taking daily  0  . Cholecalciferol (VITAMIN D3) 2000 units TABS Taking daily 30 tablet   . Multiple Vitamin (MULTIVITAMIN) tablet Take by mouth.    . potassium chloride SA (K-DUR,KLOR-CON) 20 MEQ tablet Take 1 tablet (20 mEq total) by mouth daily. 90 tablet 3  . Probiotic Product (PROBIOTIC DAILY) CAPS Taking daily    . simvastatin (ZOCOR) 20 MG tablet TAKE 1 TABLET DAILY 90 tablet 3  . traMADol (ULTRAM) 50 MG tablet Take 50 mg by mouth.    . triamterene-hydrochlorothiazide (DYAZIDE) 37.5-25 MG capsule TAKE 1 CAPSULE DAILY 90 capsule 3   No current facility-administered medications on file prior to visit.     Past Medical History:  Diagnosis Date  . Hyperlipidemia   . Hypertension     Past  Surgical History:  Procedure Laterality Date  . NO PAST SURGERIES      Social History   Socioeconomic History  . Marital status: Widowed    Spouse name: Not on file  . Number of children: 4  . Years of education: Not on file  . Highest education level: Not on file  Occupational History  . Not on file  Social Needs  . Financial resource strain: Not on file  . Food insecurity:    Worry: Not on file    Inability: Not on file  . Transportation needs:    Medical: Not on file    Non-medical: Not on file  Tobacco Use  . Smoking status: Never Smoker  . Smokeless tobacco: Never Used  Substance and Sexual Activity  . Alcohol use: No  . Drug use: No  . Sexual activity: Not on file  Lifestyle  . Physical activity:    Days per week: Not on file    Minutes per session: Not on file  . Stress: Not on file  Relationships  . Social connections:    Talks on phone: Not on file    Gets together: Not on file    Attends religious service: Not on file    Active member of club or organization: Not on file    Attends meetings of clubs or organizations: Not on file    Relationship status:  Not on file  Other Topics Concern  . Not on file  Social History Narrative   Widow, lives alone      Exercises regularly      4 children    No family history on file.  Review of Systems  Constitutional: Negative for chills and fever.  Respiratory: Negative for cough, shortness of breath and wheezing.   Cardiovascular: Negative for chest pain, palpitations and leg swelling.  Musculoskeletal: Positive for arthralgias (left knee pain).  Neurological: Negative for light-headedness and headaches.       Objective:   Vitals:   09/12/18 0815  BP: 132/80  Pulse: 62  Resp: 16  Temp: 97.8 F (36.6 C)  SpO2: 94%   BP Readings from Last 3 Encounters:  09/12/18 132/80  02/28/18 (!) 144/84  08/28/17 132/72   Wt Readings from Last 3 Encounters:  09/12/18 151 lb (68.5 kg)  02/28/18 154 lb (69.9  kg)  08/28/17 149 lb (67.6 kg)   Body mass index is 25.92 kg/m.   Physical Exam    Constitutional: Appears well-developed and well-nourished. No distress.  HENT:  Head: Normocephalic and atraumatic.  Neck: Neck supple. No tracheal deviation present. No thyromegaly present.  No cervical lymphadenopathy Cardiovascular: Normal rate, regular rhythm and normal heart sounds.   No murmur heard. No carotid bruit .  No edema Pulmonary/Chest: Effort normal and breath sounds normal. No respiratory distress. No has no wheezes. No rales.  Skin: Skin is warm and dry. Not diaphoretic.  Psychiatric: Normal mood and affect. Behavior is normal.      Assessment & Plan:    See Problem List for Assessment and Plan of chronic medical problems.

## 2018-09-11 NOTE — Patient Instructions (Addendum)
  Tests ordered today. Your results will be released to MyChart (or called to you) after review, usually within 72hours after test completion. If any changes need to be made, you will be notified at that same time.  Flu immunization administered today.    Medications reviewed and updated.  Changes include :   none    Please followup in 6 months   

## 2018-09-12 ENCOUNTER — Encounter: Payer: Self-pay | Admitting: Internal Medicine

## 2018-09-12 ENCOUNTER — Other Ambulatory Visit (INDEPENDENT_AMBULATORY_CARE_PROVIDER_SITE_OTHER): Payer: Medicare Other

## 2018-09-12 ENCOUNTER — Ambulatory Visit (INDEPENDENT_AMBULATORY_CARE_PROVIDER_SITE_OTHER): Payer: Medicare Other | Admitting: Internal Medicine

## 2018-09-12 VITALS — BP 132/80 | HR 62 | Temp 97.8°F | Resp 16 | Ht 64.0 in | Wt 151.0 lb

## 2018-09-12 DIAGNOSIS — E7849 Other hyperlipidemia: Secondary | ICD-10-CM | POA: Diagnosis not present

## 2018-09-12 DIAGNOSIS — M85851 Other specified disorders of bone density and structure, right thigh: Secondary | ICD-10-CM

## 2018-09-12 DIAGNOSIS — E876 Hypokalemia: Secondary | ICD-10-CM | POA: Diagnosis not present

## 2018-09-12 DIAGNOSIS — M85852 Other specified disorders of bone density and structure, left thigh: Secondary | ICD-10-CM

## 2018-09-12 DIAGNOSIS — I10 Essential (primary) hypertension: Secondary | ICD-10-CM

## 2018-09-12 DIAGNOSIS — Z23 Encounter for immunization: Secondary | ICD-10-CM

## 2018-09-12 LAB — CBC WITH DIFFERENTIAL/PLATELET
BASOS ABS: 0.1 10*3/uL (ref 0.0–0.1)
BASOS PCT: 1.2 % (ref 0.0–3.0)
EOS PCT: 2.1 % (ref 0.0–5.0)
Eosinophils Absolute: 0.1 10*3/uL (ref 0.0–0.7)
HEMATOCRIT: 45.5 % (ref 36.0–46.0)
Hemoglobin: 15.4 g/dL — ABNORMAL HIGH (ref 12.0–15.0)
LYMPHS PCT: 44.9 % (ref 12.0–46.0)
Lymphs Abs: 2 10*3/uL (ref 0.7–4.0)
MCHC: 33.9 g/dL (ref 30.0–36.0)
MCV: 91.8 fl (ref 78.0–100.0)
MONOS PCT: 6.4 % (ref 3.0–12.0)
Monocytes Absolute: 0.3 10*3/uL (ref 0.1–1.0)
NEUTROS ABS: 2.1 10*3/uL (ref 1.4–7.7)
Neutrophils Relative %: 45.4 % (ref 43.0–77.0)
PLATELETS: 308 10*3/uL (ref 150.0–400.0)
RBC: 4.95 Mil/uL (ref 3.87–5.11)
RDW: 13.9 % (ref 11.5–15.5)
WBC: 4.5 10*3/uL (ref 4.0–10.5)

## 2018-09-12 LAB — TSH: TSH: 1.11 u[IU]/mL (ref 0.35–4.50)

## 2018-09-12 LAB — COMPREHENSIVE METABOLIC PANEL
ALT: 22 U/L (ref 0–35)
AST: 23 U/L (ref 0–37)
Albumin: 4.4 g/dL (ref 3.5–5.2)
Alkaline Phosphatase: 77 U/L (ref 39–117)
BUN: 18 mg/dL (ref 6–23)
CALCIUM: 11.1 mg/dL — AB (ref 8.4–10.5)
CHLORIDE: 102 meq/L (ref 96–112)
CO2: 27 meq/L (ref 19–32)
Creatinine, Ser: 0.87 mg/dL (ref 0.40–1.20)
GFR: 80.87 mL/min (ref >60.00)
Glucose, Bld: 88 mg/dL (ref 70–99)
Potassium: 3.6 mEq/L (ref 3.5–5.1)
Sodium: 139 mEq/L (ref 135–145)
Total Bilirubin: 0.4 mg/dL (ref 0.2–1.2)
Total Protein: 8 g/dL (ref 6.0–8.3)

## 2018-09-12 LAB — LIPID PANEL
CHOL/HDL RATIO: 3
Cholesterol: 168 mg/dL (ref 0–200)
HDL: 57.4 mg/dL (ref >39.00)
LDL CALC: 94 mg/dL (ref 0–99)
NONHDL: 110.61
TRIGLYCERIDES: 84 mg/dL (ref 0.0–149.0)
VLDL: 16.8 mg/dL (ref 0.0–40.0)

## 2018-09-12 NOTE — Assessment & Plan Note (Signed)
CMP, intact PTH Not currently on calcium supplementation

## 2018-09-12 NOTE — Assessment & Plan Note (Signed)
BP well controlled Current regimen effective and well tolerated Continue current medications at current doses cmp  

## 2018-09-12 NOTE — Assessment & Plan Note (Signed)
Check lipid panel  Continue daily statin Regular exercise and healthy diet encouraged  

## 2018-09-12 NOTE — Assessment & Plan Note (Addendum)
DEXA up-to-date Continue vitamin D daily  Continue regular exercise

## 2018-09-12 NOTE — Assessment & Plan Note (Signed)
Related to triamterene-hydrochlorothiazide Taking potassium supplementation CMP

## 2018-09-14 LAB — PTH, INTACT AND CALCIUM
CALCIUM: 10.8 mg/dL — AB (ref 8.6–10.4)
PTH: 55 pg/mL (ref 14–64)

## 2018-09-16 ENCOUNTER — Other Ambulatory Visit: Payer: Self-pay | Admitting: Internal Medicine

## 2018-09-16 DIAGNOSIS — I10 Essential (primary) hypertension: Secondary | ICD-10-CM

## 2018-09-16 NOTE — Telephone Encounter (Signed)
We are changing her medications due to hypercalcemia  Stop the triamterene-hydrochlorothiazide and potassium pills.  Start olmesartan - one pill daily (pending)   Recheck labs in 7-10 days.  (pending)

## 2018-09-17 ENCOUNTER — Telehealth: Payer: Self-pay | Admitting: Internal Medicine

## 2018-09-17 NOTE — Telephone Encounter (Signed)
Routing to dr burns, please advise, thanks 

## 2018-09-17 NOTE — Telephone Encounter (Signed)
Copied from CRM [id number]. Topic: Quick Communication - See Telephone Encounter >> Sep 17, 2018  8:44 AM Herby Abraham C wrote: CRM for notification. See Telephone encounter for: 09/17/18.  Pt says that she received a message stating that her potassium Is low. Pt says that on vm she was asked if she wanted a referral. Pt says yes, she would like a referral as soon as possible. Pt would like referral information left on her home vm.   CB: [phone]

## 2018-09-17 NOTE — Telephone Encounter (Signed)
LVM for pt to call back in regards.  

## 2018-09-18 MED ORDER — OLMESARTAN MEDOXOMIL 20 MG PO TABS: 20.0000 mg | ORAL_TABLET | Freq: Every day | ORAL | 5 refills | Status: DC

## 2018-09-18 NOTE — Telephone Encounter (Signed)
Patient returned call and stated she isnt familiar with the area and doesn't know any doctors she is reaching out to her son-in-law to get some information on doctors in area

## 2018-09-18 NOTE — Telephone Encounter (Signed)
Pt would like for Dr Burn or her assistant to give her a call back about referral  [phone]

## 2018-09-18 NOTE — Telephone Encounter (Signed)
Pt aware of medication changes. Benicar sent to pharmacy and pt will come back in 7-10 days to recheck calcium levels.

## 2018-09-18 NOTE — Telephone Encounter (Signed)
Spoke with patient and she states she is ok medication changes for elevated calcium for now. She wants to hold off on the referral. She states that she was doing research about calcium and got concerned and that is why she wanted the referral. But will do med changes first.

## 2018-09-20 ENCOUNTER — Telehealth: Payer: Self-pay | Admitting: Internal Medicine

## 2018-09-20 MED ORDER — TELMISARTAN 40 MG PO TABS: 40.0000 mg | ORAL_TABLET | Freq: Every day | ORAL | 5 refills | Status: AC

## 2018-09-20 NOTE — Telephone Encounter (Signed)
Copied from CRM [id number]. Topic: Quick Communication - See Telephone Encounter >> Sep 20, 2018  8:22 AM Jay Schlichter wrote: CRM for notification. See Telephone encounter for: 09/20/18. Pt medication olmesartan (BENICAR) 20 MG tablet is not covered by ins.  Please call [phone] for [place]  Please call pt when this is take care of.  Pt did not take current bp medicine tues or wed, but is taking her old medicine today until she can get this new med.  Cb for pt is [phone]

## 2018-09-20 NOTE — Telephone Encounter (Signed)
telmisartan sent to [facility]

## 2018-09-20 NOTE — Telephone Encounter (Signed)
Please advise on alternative.  

## 2018-09-20 NOTE — Telephone Encounter (Signed)
Pt aware.

## 2018-09-21 ENCOUNTER — Telehealth: Payer: Self-pay | Admitting: Internal Medicine

## 2018-09-21 NOTE — Telephone Encounter (Signed)
Copied from CRM [id number]. Topic: Quick Communication - See Telephone Encounter >> Sep 21, 2018 11:10 AM Jens Som A wrote: CRM for notification. See Telephone encounter for: 09/21/18.  Patient is calling because she said that her BP has been doing very well. She has not started the new BP med.  Please advise 158/82-Am-Tuesday (10/22) 142/80-noon 140-80-Bed time  137/80-Am-Wednesday (10/23) 132/72-PM  132-72-Am-Thursday(10/24) 137/74-Noon 131/72-Bed Time  132/72-Friday (10/25) 119/73-Am

## 2018-09-21 NOTE — Telephone Encounter (Signed)
Is this w/o her old BP medication?  If so she can monitor for now w/o medication and come in for blood work next week and we can check her calcium  She will need to monitor her blood pressure consistently because at some point she may need the medication.

## 2018-09-21 NOTE — Telephone Encounter (Signed)
LVM for pt to call back.

## 2018-09-24 NOTE — Telephone Encounter (Signed)
Tried calling pt again. No answer. VM was full

## 2018-09-24 NOTE — Telephone Encounter (Signed)
Pt called back on Saturday please advise

## 2018-09-25 NOTE — Telephone Encounter (Signed)
Pt aware.

## 2018-09-25 NOTE — Telephone Encounter (Signed)
It is ok to take them at the same time.

## 2018-09-25 NOTE — Telephone Encounter (Signed)
Pt wanted to double check the preferred way to take her BP medications. She has already picked up telmisartan and has taken it yesterday and today. Is it ok to take both of her BP medications together or do you prefer she does them 2 different times a day.

## 2018-10-01 ENCOUNTER — Other Ambulatory Visit (INDEPENDENT_AMBULATORY_CARE_PROVIDER_SITE_OTHER): Payer: Medicare Other

## 2018-10-01 ENCOUNTER — Ambulatory Visit (INDEPENDENT_AMBULATORY_CARE_PROVIDER_SITE_OTHER): Payer: Medicare Other | Admitting: *Deleted

## 2018-10-01 ENCOUNTER — Other Ambulatory Visit: Payer: Self-pay | Admitting: Internal Medicine

## 2018-10-01 VITALS — BP 136/76 | HR 59 | Resp 18 | Ht 64.0 in | Wt 153.0 lb

## 2018-10-01 DIAGNOSIS — I10 Essential (primary) hypertension: Secondary | ICD-10-CM

## 2018-10-01 DIAGNOSIS — Z0000 Encounter for general adult medical examination without abnormal findings: Secondary | ICD-10-CM

## 2018-10-01 LAB — BASIC METABOLIC PANEL
BUN: 13 mg/dL (ref 6–23)
CHLORIDE: 103 meq/L (ref 96–112)
CO2: 27 mEq/L (ref 19–32)
Calcium: 10.3 mg/dL (ref 8.4–10.5)
Creatinine, Ser: 0.72 mg/dL (ref 0.40–1.20)
GFR: 100.6 mL/min (ref >60.00)
Glucose, Bld: 87 mg/dL (ref 70–99)
POTASSIUM: 3 meq/L — AB (ref 3.5–5.1)
Sodium: 139 mEq/L (ref 135–145)

## 2018-10-01 MED ORDER — POTASSIUM CHLORIDE CRYS ER 20 MEQ PO TBCR: 20.0000 meq | EXTENDED_RELEASE_TABLET | Freq: Every day | ORAL | 3 refills | Status: DC

## 2018-10-01 NOTE — Patient Instructions (Addendum)
Continue doing brain stimulating activities (puzzles, reading, adult coloring books, staying active) to keep memory sharp.   Continue to eat heart healthy diet (full of fruits, vegetables, whole grains, lean protein, water--limit salt, fat, and sugar intake) and increase physical activity as tolerated.   Julie Adkins , Thank you for taking time to come for your Medicare Wellness Visit. I appreciate your ongoing commitment to your health goals. Please review the following plan we discussed and let me know if I can assist you in the future.   These are the goals we discussed: Goals    . I want to continue to play golf, enjoy life, family, and stay active volunteering in my church    . Patient Stated     I want to continue to play golf as long as I can and play in tournaments.        This is a list of the screening recommended for you and due dates:  Health Maintenance  Topic Date Due  . Tetanus Vaccine  11/28/2020*  . DEXA scan (bone density measurement)  05/16/2019  . Flu Shot  Completed  . Pneumonia vaccines  Completed  *Topic was postponed. The date shown is not the original due date.   Health Maintenance, Female Adopting a healthy lifestyle and getting preventive care can go a long way to promote health and wellness. Talk with your health care provider about what schedule of regular examinations is right for you. This is a good chance for you to check in with your provider about disease prevention and staying healthy. In between checkups, there are plenty of things you can do on your own. Experts have done a lot of research about which lifestyle changes and preventive measures are most likely to keep you healthy. Ask your health care provider for more information. Weight and diet Eat a healthy diet  Be sure to include plenty of vegetables, fruits, low-fat dairy products, and lean protein.  Do not eat a lot of foods high in solid fats, added sugars, or salt.  Get regular exercise.  This is one of the most important things you can do for your health. ? Most adults should exercise for at least 150 minutes each week. The exercise should increase your heart rate and make you sweat (moderate-intensity exercise). ? Most adults should also do strengthening exercises at least twice a week. This is in addition to the moderate-intensity exercise.  Maintain a healthy weight  Body mass index (BMI) is a measurement that can be used to identify possible weight problems. It estimates body fat based on height and weight. Your health care provider can help determine your BMI and help you achieve or maintain a healthy weight.  For females 41 years of age and older: ? A BMI below 18.5 is considered underweight. ? A BMI of 18.5 to 24.9 is normal. ? A BMI of 25 to 29.9 is considered overweight. ? A BMI of 30 and above is considered obese.  Watch levels of cholesterol and blood lipids  You should start having your blood tested for lipids and cholesterol at 78 years of age, then have this test every 5 years.  You may need to have your cholesterol levels checked more often if: ? Your lipid or cholesterol levels are high. ? You are older than 78 years of age. ? You are at high risk for heart disease.  Cancer screening Lung Cancer  Lung cancer screening is recommended for adults 70-44 years old who are  at high risk for lung cancer because of a history of smoking.  A yearly low-dose CT scan of the lungs is recommended for people who: ? Currently smoke. ? Have quit within the past 15 years. ? Have at least a 30-pack-year history of smoking. A pack year is smoking an average of one pack of cigarettes a day for 1 year.  Yearly screening should continue until it has been 15 years since you quit.  Yearly screening should stop if you develop a health problem that would prevent you from having lung cancer treatment.  Breast Cancer  Practice breast self-awareness. This means understanding  how your breasts normally appear and feel.  It also means doing regular breast self-exams. Let your health care provider know about any changes, no matter how small.  If you are in your 20s or 30s, you should have a clinical breast exam (CBE) by a health care provider every 1-3 years as part of a regular health exam.  If you are 69 or older, have a CBE every year. Also consider having a breast X-ray (mammogram) every year.  If you have a family history of breast cancer, talk to your health care provider about genetic screening.  If you are at high risk for breast cancer, talk to your health care provider about having an MRI and a mammogram every year.  Breast cancer gene (BRCA) assessment is recommended for women who have family members with BRCA-related cancers. BRCA-related cancers include: ? Breast. ? Ovarian. ? Tubal. ? Peritoneal cancers.  Results of the assessment will determine the need for genetic counseling and BRCA1 and BRCA2 testing.  Cervical Cancer Your health care provider may recommend that you be screened regularly for cancer of the pelvic organs (ovaries, uterus, and vagina). This screening involves a pelvic examination, including checking for microscopic changes to the surface of your cervix (Pap test). You may be encouraged to have this screening done every 3 years, beginning at age 73.  For women ages 78-65, health care providers may recommend pelvic exams and Pap testing every 3 years, or they may recommend the Pap and pelvic exam, combined with testing for human papilloma virus (HPV), every 5 years. Some types of HPV increase your risk of cervical cancer. Testing for HPV may also be done on women of any age with unclear Pap test results.  Other health care providers may not recommend any screening for nonpregnant women who are considered low risk for pelvic cancer and who do not have symptoms. Ask your health care provider if a screening pelvic exam is right for  you.  If you have had past treatment for cervical cancer or a condition that could lead to cancer, you need Pap tests and screening for cancer for at least 20 years after your treatment. If Pap tests have been discontinued, your risk factors (such as having a new sexual partner) need to be reassessed to determine if screening should resume. Some women have medical problems that increase the chance of getting cervical cancer. In these cases, your health care provider may recommend more frequent screening and Pap tests.  Colorectal Cancer  This type of cancer can be detected and often prevented.  Routine colorectal cancer screening usually begins at 78 years of age and continues through 78 years of age.  Your health care provider may recommend screening at an earlier age if you have risk factors for colon cancer.  Your health care provider may also recommend using home test kits to check  for hidden blood in the stool.  A small camera at the end of a tube can be used to examine your colon directly (sigmoidoscopy or colonoscopy). This is done to check for the earliest forms of colorectal cancer.  Routine screening usually begins at age 43.  Direct examination of the colon should be repeated every 5-10 years through 78 years of age. However, you may need to be screened more often if early forms of precancerous polyps or small growths are found.  Skin Cancer  Check your skin from head to toe regularly.  Tell your health care provider about any new moles or changes in moles, especially if there is a change in a mole's shape or color.  Also tell your health care provider if you have a mole that is larger than the size of a pencil eraser.  Always use sunscreen. Apply sunscreen liberally and repeatedly throughout the day.  Protect yourself by wearing long sleeves, pants, a wide-brimmed hat, and sunglasses whenever you are outside.  Heart disease, diabetes, and high blood pressure  High blood  pressure causes heart disease and increases the risk of stroke. High blood pressure is more likely to develop in: ? People who have blood pressure in the high end of the normal range (130-139/85-89 mm Hg). ? People who are overweight or obese. ? People who are African American.  If you are 40-51 years of age, have your blood pressure checked every 3-5 years. If you are 37 years of age or older, have your blood pressure checked every year. You should have your blood pressure measured twice-once when you are at a hospital or clinic, and once when you are not at a hospital or clinic. Record the average of the two measurements. To check your blood pressure when you are not at a hospital or clinic, you can use: ? An automated blood pressure machine at a pharmacy. ? A home blood pressure monitor.  If you are between 78 years and 43 years old, ask your health care provider if you should take aspirin to prevent strokes.  Have regular diabetes screenings. This involves taking a blood sample to check your fasting blood sugar level. ? If you are at a normal weight and have a low risk for diabetes, have this test once every three years after 78 years of age. ? If you are overweight and have a high risk for diabetes, consider being tested at a younger age or more often. Preventing infection Hepatitis B  If you have a higher risk for hepatitis B, you should be screened for this virus. You are considered at high risk for hepatitis B if: ? You were born in a country where hepatitis B is common. Ask your health care provider which countries are considered high risk. ? Your parents were born in a high-risk country, and you have not been immunized against hepatitis B (hepatitis B vaccine). ? You have HIV or AIDS. ? You use needles to inject street drugs. ? You live with someone who has hepatitis B. ? You have had sex with someone who has hepatitis B. ? You get hemodialysis treatment. ? You take certain  medicines for conditions, including cancer, organ transplantation, and autoimmune conditions.  Hepatitis C  Blood testing is recommended for: ? Everyone born from [birth year] through 1966. ? Anyone with known risk factors for hepatitis C.  Sexually transmitted infections (STIs)  You should be screened for sexually transmitted infections (STIs) including gonorrhea and chlamydia if: ? You are  sexually active and are younger than 78 years of age. ? You are older than 78 years of age and your health care provider tells you that you are at risk for this type of infection. ? Your sexual activity has changed since you were last screened and you are at an increased risk for chlamydia or gonorrhea. Ask your health care provider if you are at risk.  If you do not have HIV, but are at risk, it may be recommended that you take a prescription medicine daily to prevent HIV infection. This is called pre-exposure prophylaxis (PrEP). You are considered at risk if: ? You are sexually active and do not regularly use condoms or know the HIV status of your partner(s). ? You take drugs by injection. ? You are sexually active with a partner who has HIV.  Talk with your health care provider about whether you are at high risk of being infected with HIV. If you choose to begin PrEP, you should first be tested for HIV. You should then be tested every 3 months for as long as you are taking PrEP. Pregnancy  If you are premenopausal and you may become pregnant, ask your health care provider about preconception counseling.  If you may become pregnant, take 400 to 800 micrograms (mcg) of folic acid every day.  If you want to prevent pregnancy, talk to your health care provider about birth control (contraception). Osteoporosis and menopause  Osteoporosis is a disease in which the bones lose minerals and strength with aging. This can result in serious bone fractures. Your risk for osteoporosis can be identified using a bone  density scan.  If you are 54 years of age or older, or if you are at risk for osteoporosis and fractures, ask your health care provider if you should be screened.  Ask your health care provider whether you should take a calcium or vitamin D supplement to lower your risk for osteoporosis.  Menopause may have certain physical symptoms and risks.  Hormone replacement therapy may reduce some of these symptoms and risks. Talk to your health care provider about whether hormone replacement therapy is right for you. Follow these instructions at home:  Schedule regular health, dental, and eye exams.  Stay current with your immunizations.  Do not use any tobacco products including cigarettes, chewing tobacco, or electronic cigarettes.  If you are pregnant, do not drink alcohol.  If you are breastfeeding, limit how much and how often you drink alcohol.  Limit alcohol intake to no more than 1 drink per day for nonpregnant women. One drink equals 12 ounces of beer, 5 ounces of Neriah Brott, or 1 ounces of hard liquor.  Do not use street drugs.  Do not share needles.  Ask your health care provider for help if you need support or information about quitting drugs.  Tell your health care provider if you often feel depressed.  Tell your health care provider if you have ever been abused or do not feel safe at home. This information is not intended to replace advice given to you by your health care provider. Make sure you discuss any questions you have with your health care provider. Document Released: 05/30/2011 Document Revised: 04/21/2016 Document Reviewed: 08/18/2015 Elsevier Interactive Patient Education  [employer].

## 2018-10-01 NOTE — Progress Notes (Addendum)
Subjective:   Julie Adkins is a 77 y.o. female who presents for an Initial Medicare Annual Wellness Visit.  Review of Systems    No ROS.  Medicare Wellness Visit. Additional risk factors are reflected in the social history. Cardiac Risk Factors include: advanced age (>71men, >33 women);dyslipidemia;hypertension Sleep patterns: feels rested on waking, gets up 2 times nightly to void and sleeps 7-8 hours nightly.    Home Safety/Smoke Alarms: Feels safe in home. Smoke alarms in place.  Living environment; residence and Firearm Safety: 2-story house, can live on one level, no firearms. Lives alone, no needs for DME, good support system Seat Belt Safety/Bike Helmet: Wears seat belt.       Objective:    Today's Vitals   10/01/18 1034  BP: 136/76  Pulse: (!) 59  Resp: 18  SpO2: 98%  Weight: 153 lb (69.4 kg)  Height: 5\' 4"  (1.626 m)   Body mass index is 26.26 kg/m.  Advanced Directives 10/01/2018 08/28/2017 05/02/2017 01/22/2015  Does Patient Have a Medical Advance Directive? Yes Yes No No  Type of Estate agent of [place];Living will Healthcare Power of [place];Living will - -  Copy of Healthcare Power of Attorney in Chart? No - copy requested No - copy requested - -  Would patient like information on creating a medical advance directive? - - No - Patient declined No - patient declined information    Current Medications (verified) Outpatient Encounter Medications as of 10/01/2018  Medication Sig  . amLODipine (NORVASC) 5 MG tablet TAKE 1 TABLET DAILY  . aspirin 81 MG tablet Take 81 mg by mouth daily.  . B Complex CAPS Taking daily  . Cholecalciferol (VITAMIN D3) 2000 units TABS Taking daily  . Multiple Vitamin (MULTIVITAMIN) tablet Take by mouth.  . simvastatin (ZOCOR) 20 MG tablet TAKE 1 TABLET DAILY  . telmisartan (MICARDIS) 40 MG tablet Take 1 tablet (40 mg total) by mouth daily.  . traMADol (ULTRAM) 50 MG tablet Take 50 mg by mouth.  . [DISCONTINUED]  potassium chloride SA (K-DUR,KLOR-CON) 20 MEQ tablet Take 1 tablet (20 mEq total) by mouth daily. (Patient not taking: Reported on 10/01/2018)  . [DISCONTINUED] Probiotic Product (PROBIOTIC DAILY) CAPS Taking daily (Patient not taking: Reported on 10/01/2018)   No facility-administered encounter medications on file as of 10/01/2018.     Allergies (verified) Penicillins   History: Past Medical History:  Diagnosis Date  . Hyperlipidemia   . Hypertension    Past Surgical History:  Procedure Laterality Date  . NO PAST SURGERIES     No family history on file. Social History   Socioeconomic History  . Marital status: Widowed    Spouse name: Not on file  . Number of children: 4  . Years of education: Not on file  . Highest education level: Not on file  Occupational History  . Not on file  Social Needs  . Financial resource strain: Not hard at all  . Food insecurity:    Worry: Never true    Inability: Never true  . Transportation needs:    Medical: No    Non-medical: No  Tobacco Use  . Smoking status: Never Smoker  . Smokeless tobacco: Never Used  Substance and Sexual Activity  . Alcohol use: No  . Drug use: No  . Sexual activity: Not Currently  Lifestyle  . Physical activity:    Days per week: 4 days    Minutes per session: 40 min  . Stress: Not at all  Relationships  . Social connections:    Talks on phone: More than three times a week    Gets together: More than three times a week    Attends religious service: More than 4 times per year    Active member of club or organization: Yes    Attends meetings of clubs or organizations: More than 4 times per year    Relationship status: Widowed  Other Topics Concern  . Not on file  Social History Narrative   Widow, lives alone      Exercises regularly      4 children    Tobacco Counseling Counseling given: Not Answered  Activities of Daily Living In your present state of health, do you have any difficulty  performing the following activities: 10/01/2018  Hearing? N  Vision? N  Difficulty concentrating or making decisions? N  Walking or climbing stairs? N  Dressing or bathing? N  Doing errands, shopping? N  Preparing Food and eating ? N  Using the Toilet? N  In the past six months, have you accidently leaked urine? N  Do you have problems with loss of bowel control? N  Managing your Medications? N  Managing your Finances? N  Housekeeping or managing your Housekeeping? N  Some recent data might be hidden     Immunizations and Health Maintenance Immunization History  Administered Date(s) Administered  . Influenza, High Dose Seasonal PF 08/28/2017, 09/12/2018  . Pneumococcal Conjugate-13 02/08/2017  . Pneumococcal Polysaccharide-23 06/28/2013   There are no preventive care reminders to display for this patient.  Patient Care Team: Pincus Sanes, MD as PCP - General (Internal Medicine)  Indicate any recent Medical Services you may have received from other than [facility] providers in the past year (date may be approximate).     Assessment:   This is a routine wellness examination for [place]. Physical assessment deferred to PCP.   Hearing/Vision screen Hearing Screening Comments: Able to hear conversational tones w/o difficulty. No issues reported.   Passed whisper test Vision Screening Comments: appointment yearly Dr. Lodema Hong  Dietary issues and exercise activities discussed: Current Exercise Habits: Structured exercise class, Time (Minutes): 55, Frequency (Times/Week): 6, Weekly Exercise (Minutes/Week): 330, Intensity: Mild, Exercise limited by: orthopedic condition(s)  Diet (meal preparation, eat out, water intake, caffeinated beverages, dairy products, fruits and vegetables): in general, a "healthy" diet  , well balanced. States she supplements with Boost.  Reviewed heart healthy diet. Encouraged patient to increase daily water and healthy fluid intake.  Goals    . I want to  continue to play golf, enjoy life, family, and stay active volunteering in my church    . Patient Stated     I want to continue to play golf as long as I can and play in tournaments.       Depression Screen PHQ 2/9 Scores 10/01/2018 09/12/2018 08/28/2017 02/08/2017  PHQ - 2 Score 0 0 1 0  PHQ- 9 Score 0 0 1 -    Fall Risk Fall Risk  10/01/2018 09/12/2018 08/28/2017 02/08/2017  Falls in the past year? 0 No No No   Cognitive Function: MMSE - Mini Mental State Exam 10/01/2018 08/28/2017  Orientation to time 5 5  Orientation to Place 5 5  Registration 3 -  Attention/ Calculation 5 -  Recall 1 -  Language- name 2 objects 2 -  Language- repeat 1 -  Language- follow 3 step command 3 -  Language- read & follow direction 1 -  Write a  sentence 1 -  Copy design 1 -  Total score 28 -        Screening Tests Health Maintenance  Topic Date Due  . TETANUS/TDAP  11/28/2020 (Originally 03/04/1960)  . DEXA SCAN  05/16/2019  . INFLUENZA VACCINE  Completed  . PNA vac Low Risk Adult  Completed     Plan:     Continue doing brain stimulating activities (puzzles, reading, adult coloring books, staying active) to keep memory sharp.   Continue to eat heart healthy diet (full of fruits, vegetables, whole grains, lean protein, water--limit salt, fat, and sugar intake) and increase physical activity as tolerated.  I have personally reviewed and noted the following in the patient's chart:   . Medical and social history . Use of alcohol, tobacco or illicit drugs  . Current medications and supplements . Functional ability and status . Nutritional status . Physical activity . Advanced directives . List of other physicians . Vitals . Screenings to include cognitive, depression, and falls . Referrals and appointments  In addition, I have reviewed and discussed with patient certain preventive protocols, quality metrics, and best practice recommendations. A written personalized care plan for  preventive services as well as general preventive health recommendations were provided to patient.     Wanda Plump, RN   10/01/2018    Medical screening examination/treatment/procedure(s) were performed by non-physician practitioner and as supervising physician I was immediately available for consultation/collaboration. I agree with above. Pincus Sanes, MD

## 2018-10-03 ENCOUNTER — Other Ambulatory Visit: Payer: Self-pay | Admitting: Internal Medicine

## 2018-10-05 ENCOUNTER — Ambulatory Visit: Payer: Medicare Other

## 2018-10-15 DIAGNOSIS — Z1231 Encounter for screening mammogram for malignant neoplasm of breast: Secondary | ICD-10-CM | POA: Diagnosis not present

## 2018-10-22 DIAGNOSIS — R928 Other abnormal and inconclusive findings on diagnostic imaging of breast: Secondary | ICD-10-CM | POA: Diagnosis not present

## 2018-10-22 DIAGNOSIS — N6311 Unspecified lump in the right breast, upper outer quadrant: Secondary | ICD-10-CM | POA: Diagnosis not present

## 2018-10-22 DIAGNOSIS — R922 Inconclusive mammogram: Secondary | ICD-10-CM | POA: Diagnosis not present

## 2018-10-22 DIAGNOSIS — N6313 Unspecified lump in the right breast, lower outer quadrant: Secondary | ICD-10-CM | POA: Diagnosis not present

## 2018-12-04 ENCOUNTER — Ambulatory Visit: Payer: Self-pay

## 2018-12-04 NOTE — Telephone Encounter (Signed)
Patient called in with c/o "nasal congestion and cough." She says "I'm not coughing as much now, but when I was it was yellow coming up. The cough started about 5 days ago and is almost gone now. I am having nasal congestion. I've been taking Coricidin HP for a few days and it seemed to help, but now everything is coming back. I don't have pain." I asked about fever, she denies. I asked about other symptoms, she denies any except nasal congestion, she says "my nose is not running, but when I blow, it's clear coming out." According to protocol, see PCP within 3 days, unable to schedule the patient on Thursday, 12/06/18 due to Same Day. I called the office and was on hold. I advised the patient I will send this note to the office and someone will call with the appointment on Thursday, 12/06/18, she verbalized understanding.   Reason for Disposition . [1] Sinus congestion (pressure, fullness) AND [2] present > 10 days  Answer Assessment - Initial Assessment Questions 1. ONSET: "When did the cough begin?"      5 days ago 2. SEVERITY: "How bad is the cough today?"      Almost gone 3. RESPIRATORY DISTRESS: "Describe your breathing."      My breathing is fine 4. FEVER: "Do you have a fever?" If so, ask: "What is your temperature, how was it measured, and when did it start?"     No 5. SPUTUM: "Describe the color of your sputum" (clear, white, yellow, green)     Yellow when I was coughing 6. HEMOPTYSIS: "Are you coughing up any blood?" If so ask: "How much?" (flecks, streaks, tablespoons, etc.)     No 7. CARDIAC HISTORY: "Do you have any history of heart disease?" (e.g., heart attack, congestive heart failure)      No 8. LUNG HISTORY: "Do you have any history of lung disease?"  (e.g., pulmonary embolus, asthma, emphysema)     No 9. PE RISK FACTORS: "Do you have a history of blood clots?" (or: recent major surgery, recent prolonged travel, bedridden)    No 10. OTHER SYMPTOMS: "Do you have any other  symptoms?" (e.g., runny nose, wheezing, chest pain)    Nasal congestion 11. PREGNANCY: "Is there any chance you are pregnant?" "When was your last menstrual period?"       No 12. TRAVEL: "Have you traveled out of the country in the last month?" (e.g., travel history, exposures)       No  Answer Assessment - Initial Assessment Questions 1. LOCATION: "Where does it hurt?"      No pain 2. ONSET: "When did the sinus pain start?"  (e.g., hours, days)      N/A 3. SEVERITY: "How bad is the pain?"   (Scale 1-10; mild, moderate or severe)   - MILD (1-3): doesn't interfere with normal activities    - MODERATE (4-7): interferes with normal activities (e.g., work or school) or awakens from sleep   - SEVERE (8-10): excruciating pain and patient unable to do any normal activities        N/A 4. RECURRENT SYMPTOM: "Have you ever had sinus problems before?" If so, ask: "When was the last time?" and "What happened that time?"      No 5. NASAL CONGESTION: "Is the nose blocked?" If so, ask, "Can you open it or must you breathe through the mouth?"     No 6. NASAL DISCHARGE: "Do you have discharge from your nose?" If so ask, "  What color?"     Clear when blow 7. FEVER: "Do you have a fever?" If so, ask: "What is it, how was it measured, and when did it start?"      No 8. OTHER SYMPTOMS: "Do you have any other symptoms?" (e.g., sore throat, cough, earache, difficulty breathing)     No 9. PREGNANCY: "Is there any chance you are pregnant?" "When was your last menstrual period?"     No  Protocols used: SINUS PAIN OR CONGESTION-A-AH, COUGH - ACUTE PRODUCTIVE-A-AH

## 2018-12-04 NOTE — Telephone Encounter (Signed)
Patient scheduled.

## 2018-12-05 NOTE — Progress Notes (Signed)
Subjective:    Patient ID: Julie Adkins, female    DOB: December 25, 1940, 78 y.o.   MRN: [id number]  HPI She is here for an acute visit for cold symptoms.  Her symptoms started about 2 weeks ago.   She is experiencing nasal congestion, postnasal drip, possible sinus pressure, minimal cough, headaches and some dizziness.  She denies any fevers or chills, shortness of breath and wheezing.  She has taken coricidin cold products  Her blood pressure is elevated here today.  She is taking the medication.  She has not dropped her blood pressure recently at home.  She has not taken any other cold products besides Coricidin.  Medications and allergies reviewed with patient and updated if appropriate.  Patient Active Problem List   Diagnosis Date Noted  . Hypercalcemia 09/12/2018  . Tinnitus of both ears 02/28/2018  . Leg cramping 02/28/2018  . Osteopenia 05/21/2017  . Hypokalemia 08/11/2016  . Essential hypertension, benign 02/09/2016  . Hyperlipidemia 02/09/2016  . Sciatica of right side 02/09/2016    Current Outpatient Medications on File Prior to Visit  Medication Sig Dispense Refill  . amLODipine (NORVASC) 5 MG tablet TAKE 1 TABLET DAILY 90 tablet 1  . aspirin 81 MG tablet Take 81 mg by mouth daily.    . B Complex CAPS Taking daily  0  . Cholecalciferol (VITAMIN D3) 2000 units TABS Taking daily 30 tablet   . Multiple Vitamin (MULTIVITAMIN) tablet Take by mouth.    . potassium chloride SA (K-DUR,KLOR-CON) 20 MEQ tablet Take 1 tablet (20 mEq total) by mouth daily. 90 tablet 3  . simvastatin (ZOCOR) 20 MG tablet TAKE 1 TABLET DAILY 90 tablet 3  . telmisartan (MICARDIS) 40 MG tablet Take 1 tablet (40 mg total) by mouth daily. 30 tablet 5  . traMADol (ULTRAM) 50 MG tablet Take 50 mg by mouth.     No current facility-administered medications on file prior to visit.     Past Medical History:  Diagnosis Date  . Hyperlipidemia   . Hypertension     Past Surgical History:  Procedure  Laterality Date  . NO PAST SURGERIES      Social History   Socioeconomic History  . Marital status: Widowed    Spouse name: Not on file  . Number of children: 4  . Years of education: Not on file  . Highest education level: Not on file  Occupational History  . Not on file  Social Needs  . Financial resource strain: Not hard at all  . Food insecurity:    Worry: Never true    Inability: Never true  . Transportation needs:    Medical: No    Non-medical: No  Tobacco Use  . Smoking status: Never Smoker  . Smokeless tobacco: Never Used  Substance and Sexual Activity  . Alcohol use: No  . Drug use: No  . Sexual activity: Not Currently  Lifestyle  . Physical activity:    Days per week: 4 days    Minutes per session: 40 min  . Stress: Not at all  Relationships  . Social connections:    Talks on phone: More than three times a week    Gets together: More than three times a week    Attends religious service: More than 4 times per year    Active member of club or organization: Yes    Attends meetings of clubs or organizations: More than 4 times per year    Relationship status: Widowed  Other Topics Concern  . Not on file  Social History Narrative   Widow, lives alone      Exercises regularly      4 children    No family history on file.  Review of Systems  Constitutional: Negative for chills and fever.  HENT: Positive for congestion, postnasal drip and sinus pressure. Negative for ear pain and sore throat (resolved).   Respiratory: Positive for cough (minimal). Negative for shortness of breath and wheezing.   Gastrointestinal: Negative for nausea.  Musculoskeletal: Negative for myalgias.  Neurological: Positive for dizziness (one day) and headaches (mid). Negative for light-headedness.       Objective:   Vitals:   12/06/18 1041  BP: (!) 174/98  Pulse: 61  Resp: 16  Temp: 98.2 F (36.8 C)  SpO2: 97%   Filed Weights   12/06/18 1041  Weight: 150 lb (68 kg)    Body mass index is 25.75 kg/m.   BP Readings from Last 3 Encounters:  12/06/18 (!) 174/98  10/01/18 136/76  09/12/18 132/80    Wt Readings from Last 3 Encounters:  12/06/18 150 lb (68 kg)  10/01/18 153 lb (69.4 kg)  09/12/18 151 lb (68.5 kg)     Physical Exam GENERAL APPEARANCE: Appears stated age, well appearing, NAD EYES: conjunctiva clear, no icterus HEENT: bilateral tympanic membranes and ear canals normal, oropharynx with mild erythema, no thyromegaly, trachea midline, no cervical or supraclavicular lymphadenopathy LUNGS: Clear to auscultation without wheeze or crackles, unlabored breathing, good air entry bilaterally CARDIOVASCULAR: Normal S1,S2 without murmurs, no edema SKIN: warm, dry        Assessment & Plan:   See Problem List for Assessment and Plan of chronic medical problems.

## 2018-12-06 ENCOUNTER — Ambulatory Visit (INDEPENDENT_AMBULATORY_CARE_PROVIDER_SITE_OTHER): Payer: Medicare Other | Admitting: Internal Medicine

## 2018-12-06 ENCOUNTER — Encounter: Payer: Self-pay | Admitting: Internal Medicine

## 2018-12-06 VITALS — BP 174/98 | HR 61 | Temp 98.2°F | Resp 16 | Ht 64.0 in | Wt 150.0 lb

## 2018-12-06 DIAGNOSIS — I10 Essential (primary) hypertension: Secondary | ICD-10-CM

## 2018-12-06 DIAGNOSIS — J0100 Acute maxillary sinusitis, unspecified: Secondary | ICD-10-CM

## 2018-12-06 MED ORDER — AZITHROMYCIN 250 MG PO TABS: ORAL_TABLET | ORAL | 0 refills | Status: AC

## 2018-12-06 MED ORDER — POTASSIUM CHLORIDE CRYS ER 20 MEQ PO TBCR: 20.0000 meq | EXTENDED_RELEASE_TABLET | Freq: Every day | ORAL | 3 refills | Status: AC

## 2018-12-06 NOTE — Assessment & Plan Note (Signed)
Likely bacterial  Start zpak otc cold medications-Coricidin Rest, fluid Call if no improvement

## 2018-12-06 NOTE — Patient Instructions (Signed)
Take the antibiotic as prescribed - complete the entire course.  Start the saline nasal spray.    Continue over the counter cold medication, advil and tylenol.  Increase your fluids and rest.    Call if no improvement       Sinusitis, Adult Sinusitis is inflammation of your sinuses. Sinuses are hollow spaces in the bones around your face. Your sinuses are located:  Around your eyes.  In the middle of your forehead.  Behind your nose.  In your cheekbones. Mucus normally drains out of your sinuses. When your nasal tissues become inflamed or swollen, mucus can become trapped or blocked. This allows bacteria, viruses, and fungi to grow, which leads to infection. Most infections of the sinuses are caused by a virus. Sinusitis can develop quickly. It can last for up to 4 weeks (acute) or for more than 12 weeks (chronic). Sinusitis often develops after a cold. What are the causes? This condition is caused by anything that creates swelling in the sinuses or stops mucus from draining. This includes:  Allergies.  Asthma.  Infection from bacteria or viruses.  Deformities or blockages in your nose or sinuses.  Abnormal growths in the nose (nasal polyps).  Pollutants, such as chemicals or irritants in the air.  Infection from fungi (rare). What increases the risk? You are more likely to develop this condition if you:  Have a weak body defense system (immune system).  Do a lot of swimming or diving.  Overuse nasal sprays.  Smoke. What are the signs or symptoms? The main symptoms of this condition are pain and a feeling of pressure around the affected sinuses. Other symptoms include:  Stuffy nose or congestion.  Thick drainage from your nose.  Swelling and warmth over the affected sinuses.  Headache.  Upper toothache.  A cough that may get worse at night.  Extra mucus that collects in the throat or the back of the nose (postnasal drip).  Decreased sense of smell and  taste.  Fatigue.  A fever.  Sore throat.  Bad breath. How is this diagnosed? This condition is diagnosed based on:  Your symptoms.  Your medical history.  A physical exam.  Tests to find out if your condition is acute or chronic. This may include: ? Checking your nose for nasal polyps. ? Viewing your sinuses using a device that has a light (endoscope). ? Testing for allergies or bacteria. ? Imaging tests, such as an MRI or CT scan. In rare cases, a bone biopsy may be done to rule out more serious types of fungal sinus disease. How is this treated? Treatment for sinusitis depends on the cause and whether your condition is chronic or acute.  If caused by a virus, your symptoms should go away on their own within 10 days. You may be given medicines to relieve symptoms. They include: ? Medicines that shrink swollen nasal passages (topical intranasal decongestants). ? Medicines that treat allergies (antihistamines). ? A spray that eases inflammation of the nostrils (topical intranasal corticosteroids). ? Rinses that help get rid of thick mucus in your nose (nasal saline washes).  If caused by bacteria, your health care provider may recommend waiting to see if your symptoms improve. Most bacterial infections will get better without antibiotic medicine. You may be given antibiotics if you have: ? A severe infection. ? A weak immune system.  If caused by narrow nasal passages or nasal polyps, you may need to have surgery. Follow these instructions at home: Medicines  Take,  use, or apply over-the-counter and prescription medicines only as told by your health care provider. These may include nasal sprays.  If you were prescribed an antibiotic medicine, take it as told by your health care provider. Do not stop taking the antibiotic even if you start to feel better. Hydrate and humidify   Drink enough fluid to keep your urine pale yellow. Staying hydrated will help to thin your  mucus.  Use a cool mist humidifier to keep the humidity level in your home above 50%.  Inhale steam for 10-15 minutes, 3-4 times a day, or as told by your health care provider. You can do this in the bathroom while a hot shower is running.  Limit your exposure to cool or dry air. Rest  Rest as much as possible.  Sleep with your head raised (elevated).  Make sure you get enough sleep each night. General instructions   Apply a warm, moist washcloth to your face 3-4 times a day or as told by your health care provider. This will help with discomfort.  Wash your hands often with soap and water to reduce your exposure to germs. If soap and water are not available, use hand sanitizer.  Do not smoke. Avoid being around people who are smoking (secondhand smoke).  Keep all follow-up visits as told by your health care provider. This is important. Contact a health care provider if:  You have a fever.  Your symptoms get worse.  Your symptoms do not improve within 10 days. Get help right away if:  You have a severe headache.  You have persistent vomiting.  You have severe pain or swelling around your face or eyes.  You have vision problems.  You develop confusion.  Your neck is stiff.  You have trouble breathing. Summary  Sinusitis is soreness and inflammation of your sinuses. Sinuses are hollow spaces in the bones around your face.  This condition is caused by nasal tissues that become inflamed or swollen. The swelling traps or blocks the flow of mucus. This allows bacteria, viruses, and fungi to grow, which leads to infection.  If you were prescribed an antibiotic medicine, take it as told by your health care provider. Do not stop taking the antibiotic even if you start to feel better.  Keep all follow-up visits as told by your health care provider. This is important. This information is not intended to replace advice given to you by your health care provider. Make sure you  discuss any questions you have with your health care provider. Document Released: 11/14/2005 Document Revised: 04/16/2018 Document Reviewed: 04/16/2018 Elsevier Interactive Patient Education  2019 [employer].

## 2018-12-06 NOTE — Assessment & Plan Note (Signed)
Blood pressure typically well controlled, elevated here She is taking her medication Advised her to start monitoring at home regularly and call if it is not controlled

## 2018-12-08 ENCOUNTER — Encounter (HOSPITAL_BASED_OUTPATIENT_CLINIC_OR_DEPARTMENT_OTHER): Payer: Self-pay

## 2018-12-08 ENCOUNTER — Emergency Department (HOSPITAL_BASED_OUTPATIENT_CLINIC_OR_DEPARTMENT_OTHER)
Admission: EM | Admit: 2018-12-08 | Discharge: 2018-12-08 | Disposition: A | Discharge status: Home / Self Care | Payer: Medicare Other | Attending: Emergency Medicine | Admitting: Emergency Medicine

## 2018-12-08 ENCOUNTER — Other Ambulatory Visit: Payer: Self-pay

## 2018-12-08 DIAGNOSIS — I10 Essential (primary) hypertension: Secondary | ICD-10-CM

## 2018-12-08 DIAGNOSIS — Z79899 Other long term (current) drug therapy: Secondary | ICD-10-CM | POA: Insufficient documentation

## 2018-12-08 DIAGNOSIS — Z7982 Long term (current) use of aspirin: Secondary | ICD-10-CM | POA: Diagnosis not present

## 2018-12-08 NOTE — ED Provider Notes (Signed)
[facility] EMERGENCY DEPARTMENT Provider Note   CSN: [id number] Arrival date & time: 12/08/18  0137     History   Chief Complaint Chief Complaint  Patient presents with  . Hypertension    HPI Julie Adkins is a 78 y.o. female.  The history is provided by the patient.  Hypertension  This is a chronic problem. The current episode started more than 1 week ago. The problem occurs constantly. The problem has been gradually improving. Pertinent negatives include no chest pain, no abdominal pain, no headaches and no shortness of breath. Nothing aggravates the symptoms. Nothing relieves the symptoms. She has tried nothing for the symptoms. The treatment provided no relief.    Past Medical History:  Diagnosis Date  . Hyperlipidemia   . Hypertension     Patient Active Problem List   Diagnosis Date Noted  . Acute non-recurrent maxillary sinusitis 12/06/2018  . Hypercalcemia 09/12/2018  . Tinnitus of both ears 02/28/2018  . Leg cramping 02/28/2018  . Osteopenia 05/21/2017  . Hypokalemia 08/11/2016  . Essential hypertension, benign 02/09/2016  . Hyperlipidemia 02/09/2016  . Sciatica of right side 02/09/2016    Past Surgical History:  Procedure Laterality Date  . NO PAST SURGERIES       OB History   No obstetric history on file.      Home Medications    Prior to Admission medications   Medication Sig Start Date End Date Taking? Authorizing Provider  amLODipine (NORVASC) 5 MG tablet TAKE 1 TABLET DAILY 10/03/18   Pincus Sanes, MD  aspirin 81 MG tablet Take 81 mg by mouth daily.    [provider]  azithromycin (ZITHROMAX) 250 MG tablet Take two tabs the first day and then one tab daily for four days 12/06/18   Pincus Sanes, MD  B Complex CAPS Taking daily 02/09/16   Pincus Sanes, MD  Cholecalciferol (VITAMIN D3) 2000 units TABS Taking daily 02/09/16   Pincus Sanes, MD  Multiple Vitamin (MULTIVITAMIN) tablet Take by mouth.    [provider]  potassium chloride SA (K-DUR,KLOR-CON) 20 MEQ tablet Take 1 tablet (20 mEq total) by mouth daily. 12/06/18   Pincus Sanes, MD  simvastatin (ZOCOR) 20 MG tablet TAKE 1 TABLET DAILY 05/28/18   Pincus Sanes, MD  telmisartan (MICARDIS) 40 MG tablet Take 1 tablet (40 mg total) by mouth daily. 09/20/18   Pincus Sanes, MD  traMADol (ULTRAM) 50 MG tablet Take 50 mg by mouth. 12/30/16   [provider]    Family History History reviewed. No pertinent family history.  Social History Social History   Tobacco Use  . Smoking status: Never Smoker  . Smokeless tobacco: Never Used  Substance Use Topics  . Alcohol use: No  . Drug use: No     Allergies   Penicillins   Review of Systems Review of Systems  Respiratory: Negative for shortness of breath.   Cardiovascular: Negative for chest pain.  Gastrointestinal: Negative for abdominal pain.  Neurological: Negative for headaches.  All other systems reviewed and are negative.    Physical Exam Updated Vital Signs BP (!) 148/80   Pulse 62   Temp 98.1 F (36.7 C) (Oral)   Resp 16   Ht 5\' 4"  (1.626 m)   Wt 68 kg   SpO2 100%   BMI 25.73 kg/m   Physical Exam Vitals signs and nursing note reviewed.  Constitutional:      Appearance: She is well-developed.  HENT:     Head: Normocephalic and atraumatic.     Mouth/Throat:     Mouth: Mucous membranes are moist.     Pharynx: Oropharynx is clear.  Eyes:     Extraocular Movements: Extraocular movements intact.     Conjunctiva/sclera: Conjunctivae normal.     Pupils: Pupils are equal, round, and reactive to light.  Neck:     Musculoskeletal: Normal range of motion.  Cardiovascular:     Rate and Rhythm: Normal rate and regular rhythm.     Heart sounds: No murmur. No gallop.   Pulmonary:     Effort: No respiratory distress.     Breath sounds: No stridor. No wheezing, rhonchi or rales.  Abdominal:     General: There is no distension.  Neurological:     Mental Status: She  is alert.      ED Treatments / Results  Labs (all labs ordered are listed, but only abnormal results are displayed) Labs Reviewed - No data to display  EKG None  Radiology No results found.  Procedures Procedures (including critical care time)  Medications Ordered in ED Medications - No data to display   Initial Impression / Assessment and Plan / ED Course  I have reviewed the triage vital signs and the nursing notes.  Pertinent labs & imaging results that were available during my care of the patient were reviewed by me and considered in my medical decision making (see chart for details).     This is a 78 yo F who presents with asymptomatic hypertension. They have had blood pressures as high as 200 systolic. Not had any headache, chest pain, shortness of breath, abdominal pain, back pain lower extremity edema or changes in urinary frequency beyond normal. Has been taking blood pressure medications as directed by the primary doctor. Exam does not show any evidence of neurologic changes, fluid overload, vision changes or other acute issues secondary to hypertension. As ACEP guidelines recommend not acutely lowering BP and having close PCP follow-up for asymptomatic hypertension I suggested that they keep a log of their blood pressures for the next 5-7 days and follow-up with her primary doctor for any medication changes. They know to return to the emergency department for any onset of the symptoms as listed above.   Final Clinical Impressions(s) / ED Diagnoses   Final diagnoses:  Hypertension, unspecified type    ED Discharge Orders    None       Berlyn Saylor, Barbara CowerJason, MD 12/08/18 0505

## 2018-12-08 NOTE — ED Triage Notes (Addendum)
Pt c/o elevated BP over past couple days. BP at home tonight systolic 180s and then went to [facility] and still elevated. Pt taking Coricidin and just started Zpak.

## 2018-12-08 NOTE — ED Notes (Signed)
Patient verbalizes understanding of discharge instructions. Opportunity for questioning and answers were provided. Armband removed by staff, pt discharged from ED home via POV with family. 

## 2018-12-08 NOTE — ED Notes (Signed)
Pt reports her blood pressure was last evening so she went to [facility] to get same checked. When she did it was elevated high as well and she came in for evaluation.   Pt also reports she was seen at her PCP's office yesterday morning and reports her PCP was concerned with her blood pressure being slightly elevated but she did nothing to correct same.

## 2018-12-11 ENCOUNTER — Ambulatory Visit: Payer: Self-pay | Admitting: *Deleted

## 2018-12-11 ENCOUNTER — Telehealth: Payer: Self-pay | Admitting: Internal Medicine

## 2018-12-11 DIAGNOSIS — I10 Essential (primary) hypertension: Secondary | ICD-10-CM | POA: Diagnosis not present

## 2018-12-11 DIAGNOSIS — G2581 Restless legs syndrome: Secondary | ICD-10-CM | POA: Diagnosis not present

## 2018-12-11 DIAGNOSIS — E785 Hyperlipidemia, unspecified: Secondary | ICD-10-CM | POA: Diagnosis not present

## 2018-12-11 DIAGNOSIS — E611 Iron deficiency: Secondary | ICD-10-CM | POA: Diagnosis not present

## 2018-12-11 DIAGNOSIS — M79621 Pain in right upper arm: Secondary | ICD-10-CM | POA: Diagnosis not present

## 2018-12-11 DIAGNOSIS — E876 Hypokalemia: Secondary | ICD-10-CM | POA: Diagnosis not present

## 2018-12-11 NOTE — Telephone Encounter (Signed)
    1    Mcneil, Ja-Kwan routed conversation to [employer] Nurse Triage Pool 1 hour ago (10:04 AM)    Uvaldo Rising, Ja-Kwan 1 hour ago (10:04 AM)     Copied from CRM [id number]. Topic: General - Other >> Dec 11, 2018  9:56 AM Marylen Ponto wrote: Reason for CRM: Pt stated that she had to go to the ED on Saturday morning at 2 am due to her blood pressure reading 200/100. Pt stated after being at the hospital for a while the reading went to 132/78. Pt stated she did not take her blood pressure on Sunday. Pt stated on Monday she checked her blood pressure 3 times in a row and the readings were 175/100, 157/78, and 128/72. Pt stated when she checked it today the readings were 133/82, 141/62, and 142/78. Pt stated her medication may need to be adjusted. Pt requests call back. Cb# [phone]                   Called patient back regarding her b/p readings since Sunday. She checks her b/p 3 times each time. She is concerned that her b/p medication needs to be adjusted. She did not want to schedule an appointment. She is requesting a call back from her provider. She denies any cardiac symptoms. Stated that when she got the reading of 200/100, she felt fine. She only took it because she always checks her b/p before bedtime and it just happened to get that reading. Denied having any symptoms.  Will route to flow at [facility]. Her number for call back is [phone].

## 2018-12-11 NOTE — Telephone Encounter (Signed)
Pt states that she has switched providers. She saw another provider today and BP issue has been addressed.

## 2018-12-11 NOTE — Telephone Encounter (Signed)
Is she taking her meds as prescribed?  If yes, her bp meds need to be increased - her BP is not well controlled  Increase to telmisartan to 80 mg - need to come in Friday or next week for a f/u and blood work - she has to come in.

## 2018-12-11 NOTE — Telephone Encounter (Signed)
Tried calling pt back in regards. No answer and VM was full. Do you want pt to come in in regards to BP? Her readings seemed all over the place in some of the message. I was going to suggest that she bring in her cuff to make sure it is accurate. Please advise on what you would like to do.

## 2018-12-11 NOTE — Telephone Encounter (Unsigned)
Copied from CRM [id number]. Topic: General - Other >> Dec 11, 2018  9:56 AM Julie Adkins wrote: Reason for CRM: Pt stated that she had to go to the ED on Saturday morning at 2 am due to her blood pressure reading 200/100. Pt stated after being at the hospital for a while the reading went to 132/78. Pt stated she did not take her blood pressure on Sunday. Pt stated on Monday she checked her blood pressure 3 times in a row and the readings were 175/100, 157/78, and 128/72. Pt stated when she checked it today the readings were 133/82, 141/62, and 142/78. Pt stated her medication may need to be adjusted. Pt requests call back. Cb# [phone]

## 2018-12-21 ENCOUNTER — Other Ambulatory Visit: Payer: Self-pay | Admitting: Internal Medicine

## 2018-12-21 DIAGNOSIS — E611 Iron deficiency: Secondary | ICD-10-CM | POA: Diagnosis not present

## 2018-12-21 DIAGNOSIS — I10 Essential (primary) hypertension: Secondary | ICD-10-CM | POA: Diagnosis not present

## 2018-12-21 DIAGNOSIS — E785 Hyperlipidemia, unspecified: Secondary | ICD-10-CM | POA: Diagnosis not present

## 2019-01-03 DIAGNOSIS — J101 Influenza due to other identified influenza virus with other respiratory manifestations: Secondary | ICD-10-CM | POA: Diagnosis not present

## 2019-01-03 DIAGNOSIS — R52 Pain, unspecified: Secondary | ICD-10-CM | POA: Diagnosis not present

## 2019-01-03 DIAGNOSIS — R05 Cough: Secondary | ICD-10-CM | POA: Diagnosis not present

## 2019-01-05 ENCOUNTER — Encounter (HOSPITAL_BASED_OUTPATIENT_CLINIC_OR_DEPARTMENT_OTHER): Payer: Self-pay | Admitting: Emergency Medicine

## 2019-01-05 ENCOUNTER — Emergency Department (HOSPITAL_BASED_OUTPATIENT_CLINIC_OR_DEPARTMENT_OTHER)
Admission: EM | Admit: 2019-01-05 | Discharge: 2019-01-06 | Disposition: A | Discharge status: Home / Self Care | Payer: Medicare Other | Attending: Emergency Medicine | Admitting: Emergency Medicine

## 2019-01-05 ENCOUNTER — Other Ambulatory Visit: Payer: Self-pay

## 2019-01-05 ENCOUNTER — Emergency Department (HOSPITAL_BASED_OUTPATIENT_CLINIC_OR_DEPARTMENT_OTHER): Payer: Medicare Other

## 2019-01-05 DIAGNOSIS — I252 Old myocardial infarction: Secondary | ICD-10-CM | POA: Diagnosis not present

## 2019-01-05 DIAGNOSIS — N644 Mastodynia: Secondary | ICD-10-CM | POA: Diagnosis not present

## 2019-01-05 DIAGNOSIS — I10 Essential (primary) hypertension: Secondary | ICD-10-CM | POA: Insufficient documentation

## 2019-01-05 DIAGNOSIS — R0789 Other chest pain: Secondary | ICD-10-CM | POA: Insufficient documentation

## 2019-01-05 DIAGNOSIS — Z7982 Long term (current) use of aspirin: Secondary | ICD-10-CM | POA: Diagnosis not present

## 2019-01-05 DIAGNOSIS — Z79899 Other long term (current) drug therapy: Secondary | ICD-10-CM | POA: Insufficient documentation

## 2019-01-05 HISTORY — DX: Old myocardial infarction: I25.2

## 2019-01-05 LAB — TROPONIN I: Troponin I: <0.03 ng/mL (ref <0.03)

## 2019-01-05 LAB — CBC
HCT: 45.5 % (ref 36.0–46.0)
Hemoglobin: 14.4 g/dL (ref 12.0–15.0)
MCH: 30.1 pg (ref 26.0–34.0)
MCHC: 31.6 g/dL (ref 30.0–36.0)
MCV: 95.2 fL (ref 80.0–100.0)
NRBC: 0 % (ref 0.0–0.2)
Platelets: 207 10*3/uL (ref 150–400)
RBC: 4.78 MIL/uL (ref 3.87–5.11)
RDW: 13.4 % (ref 11.5–15.5)
WBC: 3.6 10*3/uL — ABNORMAL LOW (ref 4.0–10.5)

## 2019-01-05 LAB — BASIC METABOLIC PANEL
Anion gap: 7 (ref 5–15)
BUN: 20 mg/dL (ref 8–23)
CO2: 24 mmol/L (ref 22–32)
Calcium: 10 mg/dL (ref 8.9–10.3)
Chloride: 105 mmol/L (ref 98–111)
Creatinine, Ser: 0.95 mg/dL (ref 0.44–1.00)
GFR calc Af Amer: >60 mL/min (ref >60)
GFR calc non Af Amer: 57 mL/min — ABNORMAL LOW (ref >60)
Glucose, Bld: 91 mg/dL (ref 70–99)
POTASSIUM: 3.6 mmol/L (ref 3.5–5.1)
Sodium: 136 mmol/L (ref 135–145)

## 2019-01-05 NOTE — ED Provider Notes (Signed)
[facility] EMERGENCY DEPARTMENT Provider Note   CSN: [id number] Arrival date & time: 01/05/19  2149     History   Chief Complaint Chief Complaint  Patient presents with  . Chest Pain    HPI Julie Adkins is a 78 y.o. female.  Patient states that she had chest pain earlier today. She has been sick recently and was diagnosed with the flu on Wednesday, has been on tamiflu, and was finally starting to feel better today. She was laying awake in bed resting from her flu when around 4pm today she had sudden onset L sided chest pain "that felt like gas" that was constant. It did not radiate. It did not worsen with exertion. She had no SOB or nausea or diaphoresis. She took some gas X without relief and then drank some ginger ale which allowed her to belch around 9pm which completely relieved her chest pain. She states that she is currently chest pain free. She does recall a slightly similar episode 20 years ago where she had gas pains in the L side of her chest that turned out to be a heart attack but states that this episode is very different because last time it moved centrally, was a burning severe sensation and was not relieved with belching. She has no burning sensation or sour taste.      Past Medical History:  Diagnosis Date  . Hyperlipidemia   . Hypertension   . Myocardial infarct, old     Patient Active Problem List   Diagnosis Date Noted  . Acute non-recurrent maxillary sinusitis 12/06/2018  . Hypercalcemia 09/12/2018  . Tinnitus of both ears 02/28/2018  . Leg cramping 02/28/2018  . Osteopenia 05/21/2017  . Hypokalemia 08/11/2016  . Essential hypertension, benign 02/09/2016  . Hyperlipidemia 02/09/2016  . Sciatica of right side 02/09/2016    Past Surgical History:  Procedure Laterality Date  . NO PAST SURGERIES       OB History   No obstetric history on file.      Home Medications    Prior to Admission medications   Medication Sig Start Date End  Date Taking? Authorizing Provider  amLODipine (NORVASC) 5 MG tablet TAKE 1 TABLET DAILY 10/03/18   Pincus Sanes, MD  aspirin 81 MG tablet Take 81 mg by mouth daily.    [provider]  azithromycin (ZITHROMAX) 250 MG tablet Take two tabs the first day and then one tab daily for four days 12/06/18   Pincus Sanes, MD  B Complex CAPS Taking daily 02/09/16   Pincus Sanes, MD  Cholecalciferol (VITAMIN D3) 2000 units TABS Taking daily 02/09/16   Pincus Sanes, MD  Multiple Vitamin (MULTIVITAMIN) tablet Take by mouth.    [provider]  potassium chloride SA (K-DUR,KLOR-CON) 20 MEQ tablet Take 1 tablet (20 mEq total) by mouth daily. 12/06/18   Pincus Sanes, MD  simvastatin (ZOCOR) 20 MG tablet TAKE 1 TABLET DAILY 05/28/18   Pincus Sanes, MD  telmisartan (MICARDIS) 40 MG tablet Take 1 tablet (40 mg total) by mouth daily. 09/20/18   Pincus Sanes, MD  traMADol (ULTRAM) 50 MG tablet Take 50 mg by mouth. 12/30/16   [provider]    Family History History reviewed. No pertinent family history.  Social History Social History   Tobacco Use  . Smoking status: Never Smoker  . Smokeless tobacco: Never Used  Substance Use Topics  . Alcohol use: No  . Drug use: No  Allergies   Penicillins   Review of Systems Review of Systems  Constitutional: Negative for chills, diaphoresis and fever.  HENT: Positive for congestion. Negative for rhinorrhea and sore throat.   Respiratory: Negative for cough, chest tightness, shortness of breath and wheezing.   Cardiovascular: Positive for chest pain. Negative for palpitations and leg swelling.  Gastrointestinal: Negative for abdominal pain, diarrhea, nausea and vomiting.  Musculoskeletal: Negative for arthralgias.  Neurological: Negative for light-headedness and headaches.     Physical Exam Updated Vital Signs BP (!) 131/104 (BP Location: Left Arm)   Pulse 66   Temp 98.1 F (36.7 C) (Oral)   Resp 18   Ht 5\' 4"  (1.626  m)   Wt 67.1 kg   SpO2 100%   BMI 25.40 kg/m   Physical Exam Constitutional:      General: She is not in acute distress.    Appearance: She is well-developed and normal weight.  HENT:     Head: Normocephalic and atraumatic.  Neck:     Musculoskeletal: Normal range of motion and neck supple.  Cardiovascular:     Rate and Rhythm: Normal rate and regular rhythm.     Heart sounds: Normal heart sounds. No murmur.  Pulmonary:     Effort: Pulmonary effort is normal.     Breath sounds: Normal breath sounds.  Abdominal:     General: Bowel sounds are normal.     Palpations: Abdomen is soft.     Tenderness: There is no abdominal tenderness.  Musculoskeletal: Normal range of motion.     Right lower leg: No edema.     Left lower leg: No edema.  Skin:    General: Skin is warm and dry.  Neurological:     General: No focal deficit present.     Mental Status: She is alert and oriented to person, place, and time.  Psychiatric:        Mood and Affect: Mood normal.      ED Treatments / Results  Labs (all labs ordered are listed, but only abnormal results are displayed) Labs Reviewed  BASIC METABOLIC PANEL - Abnormal; Notable for the following components:      Result Value   GFR calc non Af Amer 57 (*)    All other components within normal limits  CBC - Abnormal; Notable for the following components:   WBC 3.6 (*)    All other components within normal limits  TROPONIN I    EKG EKG Interpretation  Date/Time:  Saturday January 05 2019 21:57:09 EST Ventricular Rate:  72 PR Interval:  164 QRS Duration: 118 QT Interval:  412 QTC Calculation: 451 R Axis:   87 Text Interpretation:  Normal sinus rhythm Right bundle branch block Abnormal ECG No significant change since last tracing Confirmed by Melene PlanFloyd, Dan [phone]([id number]) on 01/05/2019 10:21:30 PM   Radiology Dg Chest 2 View  Result Date: 01/05/2019 CLINICAL DATA:  Left breast pain EXAM: CHEST - 2 VIEW COMPARISON:  04/05/2018 FINDINGS:  Heart and mediastinal contours are within normal limits. No focal opacities or effusions. No acute bony abnormality. IMPRESSION: No active cardiopulmonary disease. Electronically Signed   By: Charlett NoseKevin  Dover M.D.   On: 01/05/2019 22:46    Procedures Procedures (including critical care time)  Medications Ordered in ED Medications - No data to display   Initial Impression / Assessment and Plan / ED Course  I have reviewed the triage vital signs and the nursing notes.  Pertinent labs & imaging results that were available  during my care of the patient were reviewed by me and considered in my medical decision making (see chart for details).   Patient is well appearing on exam, stable vitals, and is chest pain free. She has no EKG changes and her troponin is negative x1. Given her cardiac history, discussed option of recheck troponin but patient voiced good understanding of risk and declined. She was given return precautions to coem back to ED if she had any resumption of her chest pain.    Final Clinical Impressions(s) / ED Diagnoses   Final diagnoses:  Atypical chest pain    ED Discharge Orders    None     Leland HerElsia J Chanceler Pullin, DO PGY-3, [facility] 01/05/2019 11:47 PM     Leland HerYoo, Tahjai Schetter J, DO 01/05/19 2347    Melene PlanFloyd, Dan, DO 01/05/19 [id number]

## 2019-01-05 NOTE — ED Notes (Signed)
Patient transported to X-ray 

## 2019-01-05 NOTE — ED Triage Notes (Signed)
Patient states that she was dx with the flu recently. She states that she is feeling better from that. Now she is having pain to her left breast  / chest region which feels similar to the last time she had a heart attack

## 2019-01-05 NOTE — ED Notes (Signed)
Pt reports feeling gas like pain to the left side of her chest all day-. Pt reports taking gas-x and drinking gingerale all day with no relief. Prior to arrival pt had a large belch and pain resolved. No pain on assessment.

## 2019-01-22 DIAGNOSIS — R319 Hematuria, unspecified: Secondary | ICD-10-CM | POA: Diagnosis not present

## 2019-01-22 DIAGNOSIS — R8290 Unspecified abnormal findings in urine: Secondary | ICD-10-CM | POA: Diagnosis not present

## 2019-01-22 DIAGNOSIS — N390 Urinary tract infection, site not specified: Secondary | ICD-10-CM | POA: Diagnosis not present

## 2019-02-06 ENCOUNTER — Telehealth: Payer: Self-pay | Admitting: Internal Medicine

## 2019-02-06 NOTE — Telephone Encounter (Signed)
I am sorry but I can not accept her.

## 2019-02-06 NOTE — Telephone Encounter (Signed)
Copied from CRM [id number]. Topic: Appointment Scheduling - Prior Auth Required for Appointment >> Feb 06, 2019  3:10 PM Terisa Starr Julie Adkins states she transferred her care to a provider with [facility] and now she is not happy and wants Dr Lawerance Bach to take her back as a Adkins. Advised she is not taking new patients but asked I still send the message.

## 2019-02-08 NOTE — Telephone Encounter (Signed)
I have informed patient of Dr. Lawerance Bach response.   When speaking with patient she has stated she would prefer a office in [facility] because that is where she lives.   Could you reach out to her to get her an appointment set up there?

## 2019-02-15 NOTE — Telephone Encounter (Signed)
SWP and she will call me back directly to set up an appt with [facility].

## 2019-02-27 DIAGNOSIS — R8290 Unspecified abnormal findings in urine: Secondary | ICD-10-CM | POA: Diagnosis not present

## 2019-02-27 DIAGNOSIS — R1032 Left lower quadrant pain: Secondary | ICD-10-CM | POA: Diagnosis not present

## 2019-03-18 ENCOUNTER — Ambulatory Visit: Payer: Medicare Other | Admitting: Internal Medicine

## 2019-03-19 DIAGNOSIS — R1032 Left lower quadrant pain: Secondary | ICD-10-CM | POA: Diagnosis not present

## 2019-03-19 DIAGNOSIS — R109 Unspecified abdominal pain: Secondary | ICD-10-CM | POA: Diagnosis not present

## 2019-04-03 DIAGNOSIS — I10 Essential (primary) hypertension: Secondary | ICD-10-CM | POA: Diagnosis not present

## 2019-04-24 DIAGNOSIS — R928 Other abnormal and inconclusive findings on diagnostic imaging of breast: Secondary | ICD-10-CM | POA: Diagnosis not present

## 2019-04-24 DIAGNOSIS — I10 Essential (primary) hypertension: Secondary | ICD-10-CM | POA: Diagnosis not present

## 2019-04-24 DIAGNOSIS — N6311 Unspecified lump in the right breast, upper outer quadrant: Secondary | ICD-10-CM | POA: Diagnosis not present

## 2019-05-02 DIAGNOSIS — I10 Essential (primary) hypertension: Secondary | ICD-10-CM | POA: Diagnosis not present

## 2019-05-10 DIAGNOSIS — Q6102 Congenital multiple renal cysts: Secondary | ICD-10-CM | POA: Diagnosis not present

## 2019-05-10 DIAGNOSIS — I150 Renovascular hypertension: Secondary | ICD-10-CM | POA: Diagnosis not present

## 2019-05-10 DIAGNOSIS — N281 Cyst of kidney, acquired: Secondary | ICD-10-CM | POA: Diagnosis not present

## 2019-06-07 DIAGNOSIS — I10 Essential (primary) hypertension: Secondary | ICD-10-CM | POA: Diagnosis not present

## 2019-06-12 DIAGNOSIS — I10 Essential (primary) hypertension: Secondary | ICD-10-CM | POA: Diagnosis not present

## 2019-06-12 DIAGNOSIS — E876 Hypokalemia: Secondary | ICD-10-CM | POA: Diagnosis not present

## 2019-08-19 DIAGNOSIS — Z23 Encounter for immunization: Secondary | ICD-10-CM | POA: Diagnosis not present

## 2019-08-27 DIAGNOSIS — H25013 Cortical age-related cataract, bilateral: Secondary | ICD-10-CM | POA: Diagnosis not present

## 2019-08-27 DIAGNOSIS — H2513 Age-related nuclear cataract, bilateral: Secondary | ICD-10-CM | POA: Diagnosis not present

## 2019-08-27 DIAGNOSIS — H401132 Primary open-angle glaucoma, bilateral, moderate stage: Secondary | ICD-10-CM | POA: Diagnosis not present

## 2019-10-07 ENCOUNTER — Ambulatory Visit: Payer: Medicare Other

## 2019-10-18 ENCOUNTER — Other Ambulatory Visit: Payer: Self-pay

## 2019-10-21 DIAGNOSIS — R5383 Other fatigue: Secondary | ICD-10-CM | POA: Diagnosis not present

## 2019-10-21 DIAGNOSIS — I10 Essential (primary) hypertension: Secondary | ICD-10-CM | POA: Diagnosis not present

## 2019-10-21 DIAGNOSIS — E782 Mixed hyperlipidemia: Secondary | ICD-10-CM | POA: Diagnosis not present

## 2019-10-21 DIAGNOSIS — Z0000 Encounter for general adult medical examination without abnormal findings: Secondary | ICD-10-CM | POA: Diagnosis not present

## 2019-10-21 DIAGNOSIS — M5416 Radiculopathy, lumbar region: Secondary | ICD-10-CM | POA: Diagnosis not present

## 2019-10-21 DIAGNOSIS — G2581 Restless legs syndrome: Secondary | ICD-10-CM | POA: Diagnosis not present

## 2019-12-13 DIAGNOSIS — I10 Essential (primary) hypertension: Secondary | ICD-10-CM | POA: Diagnosis not present

## 2019-12-18 DIAGNOSIS — I10 Essential (primary) hypertension: Secondary | ICD-10-CM | POA: Diagnosis not present

## 2020-02-19 DIAGNOSIS — N6315 Unspecified lump in the right breast, overlapping quadrants: Secondary | ICD-10-CM | POA: Diagnosis not present

## 2020-02-19 DIAGNOSIS — N6311 Unspecified lump in the right breast, upper outer quadrant: Secondary | ICD-10-CM | POA: Diagnosis not present

## 2020-02-19 DIAGNOSIS — N6313 Unspecified lump in the right breast, lower outer quadrant: Secondary | ICD-10-CM | POA: Diagnosis not present

## 2020-02-19 DIAGNOSIS — R928 Other abnormal and inconclusive findings on diagnostic imaging of breast: Secondary | ICD-10-CM | POA: Diagnosis not present

## 2020-03-12 DIAGNOSIS — M25512 Pain in left shoulder: Secondary | ICD-10-CM | POA: Diagnosis not present

## 2020-03-12 DIAGNOSIS — X500XXA Overexertion from strenuous movement or load, initial encounter: Secondary | ICD-10-CM | POA: Diagnosis not present

## 2020-03-12 DIAGNOSIS — S46912A Strain of unspecified muscle, fascia and tendon at shoulder and upper arm level, left arm, initial encounter: Secondary | ICD-10-CM | POA: Diagnosis not present

## 2020-04-21 DIAGNOSIS — I10 Essential (primary) hypertension: Secondary | ICD-10-CM | POA: Diagnosis not present

## 2020-04-21 DIAGNOSIS — E785 Hyperlipidemia, unspecified: Secondary | ICD-10-CM | POA: Diagnosis not present

## 2020-04-21 DIAGNOSIS — M25512 Pain in left shoulder: Secondary | ICD-10-CM | POA: Diagnosis not present

## 2020-04-30 DIAGNOSIS — H25813 Combined forms of age-related cataract, bilateral: Secondary | ICD-10-CM | POA: Diagnosis not present

## 2020-04-30 DIAGNOSIS — W540XXA Bitten by dog, initial encounter: Secondary | ICD-10-CM | POA: Diagnosis not present

## 2020-04-30 DIAGNOSIS — S61256A Open bite of right little finger without damage to nail, initial encounter: Secondary | ICD-10-CM | POA: Diagnosis not present

## 2020-04-30 DIAGNOSIS — Z23 Encounter for immunization: Secondary | ICD-10-CM | POA: Diagnosis not present

## 2020-04-30 DIAGNOSIS — H40053 Ocular hypertension, bilateral: Secondary | ICD-10-CM | POA: Diagnosis not present

## 2020-04-30 DIAGNOSIS — Z7982 Long term (current) use of aspirin: Secondary | ICD-10-CM | POA: Diagnosis not present

## 2020-04-30 DIAGNOSIS — I10 Essential (primary) hypertension: Secondary | ICD-10-CM | POA: Diagnosis not present

## 2020-04-30 DIAGNOSIS — Z880 Allergy status to penicillin: Secondary | ICD-10-CM | POA: Diagnosis not present

## 2020-04-30 DIAGNOSIS — Z79899 Other long term (current) drug therapy: Secondary | ICD-10-CM | POA: Diagnosis not present

## 2020-04-30 DIAGNOSIS — Y9289 Other specified places as the place of occurrence of the external cause: Secondary | ICD-10-CM | POA: Diagnosis not present

## 2020-04-30 DIAGNOSIS — Y9389 Activity, other specified: Secondary | ICD-10-CM | POA: Diagnosis not present

## 2020-05-04 DIAGNOSIS — W540XXD Bitten by dog, subsequent encounter: Secondary | ICD-10-CM | POA: Diagnosis not present

## 2020-05-04 DIAGNOSIS — S61432D Puncture wound without foreign body of left hand, subsequent encounter: Secondary | ICD-10-CM | POA: Diagnosis not present

## 2020-06-09 DIAGNOSIS — I10 Essential (primary) hypertension: Secondary | ICD-10-CM | POA: Diagnosis not present

## 2020-06-09 DIAGNOSIS — Z780 Asymptomatic menopausal state: Secondary | ICD-10-CM | POA: Diagnosis not present

## 2020-06-09 DIAGNOSIS — R2689 Other abnormalities of gait and mobility: Secondary | ICD-10-CM | POA: Diagnosis not present

## 2020-06-09 DIAGNOSIS — E785 Hyperlipidemia, unspecified: Secondary | ICD-10-CM | POA: Diagnosis not present

## 2020-06-09 DIAGNOSIS — Z0000 Encounter for general adult medical examination without abnormal findings: Secondary | ICD-10-CM | POA: Diagnosis not present

## 2020-07-20 IMAGING — CR DG CHEST 2V
2 series · 2 of 2 positions shown · non-contrast
Comparison: 04/05/2018

CLINICAL DATA: Left breast pain

EXAM:
CHEST - 2 VIEW

[w chest pa]
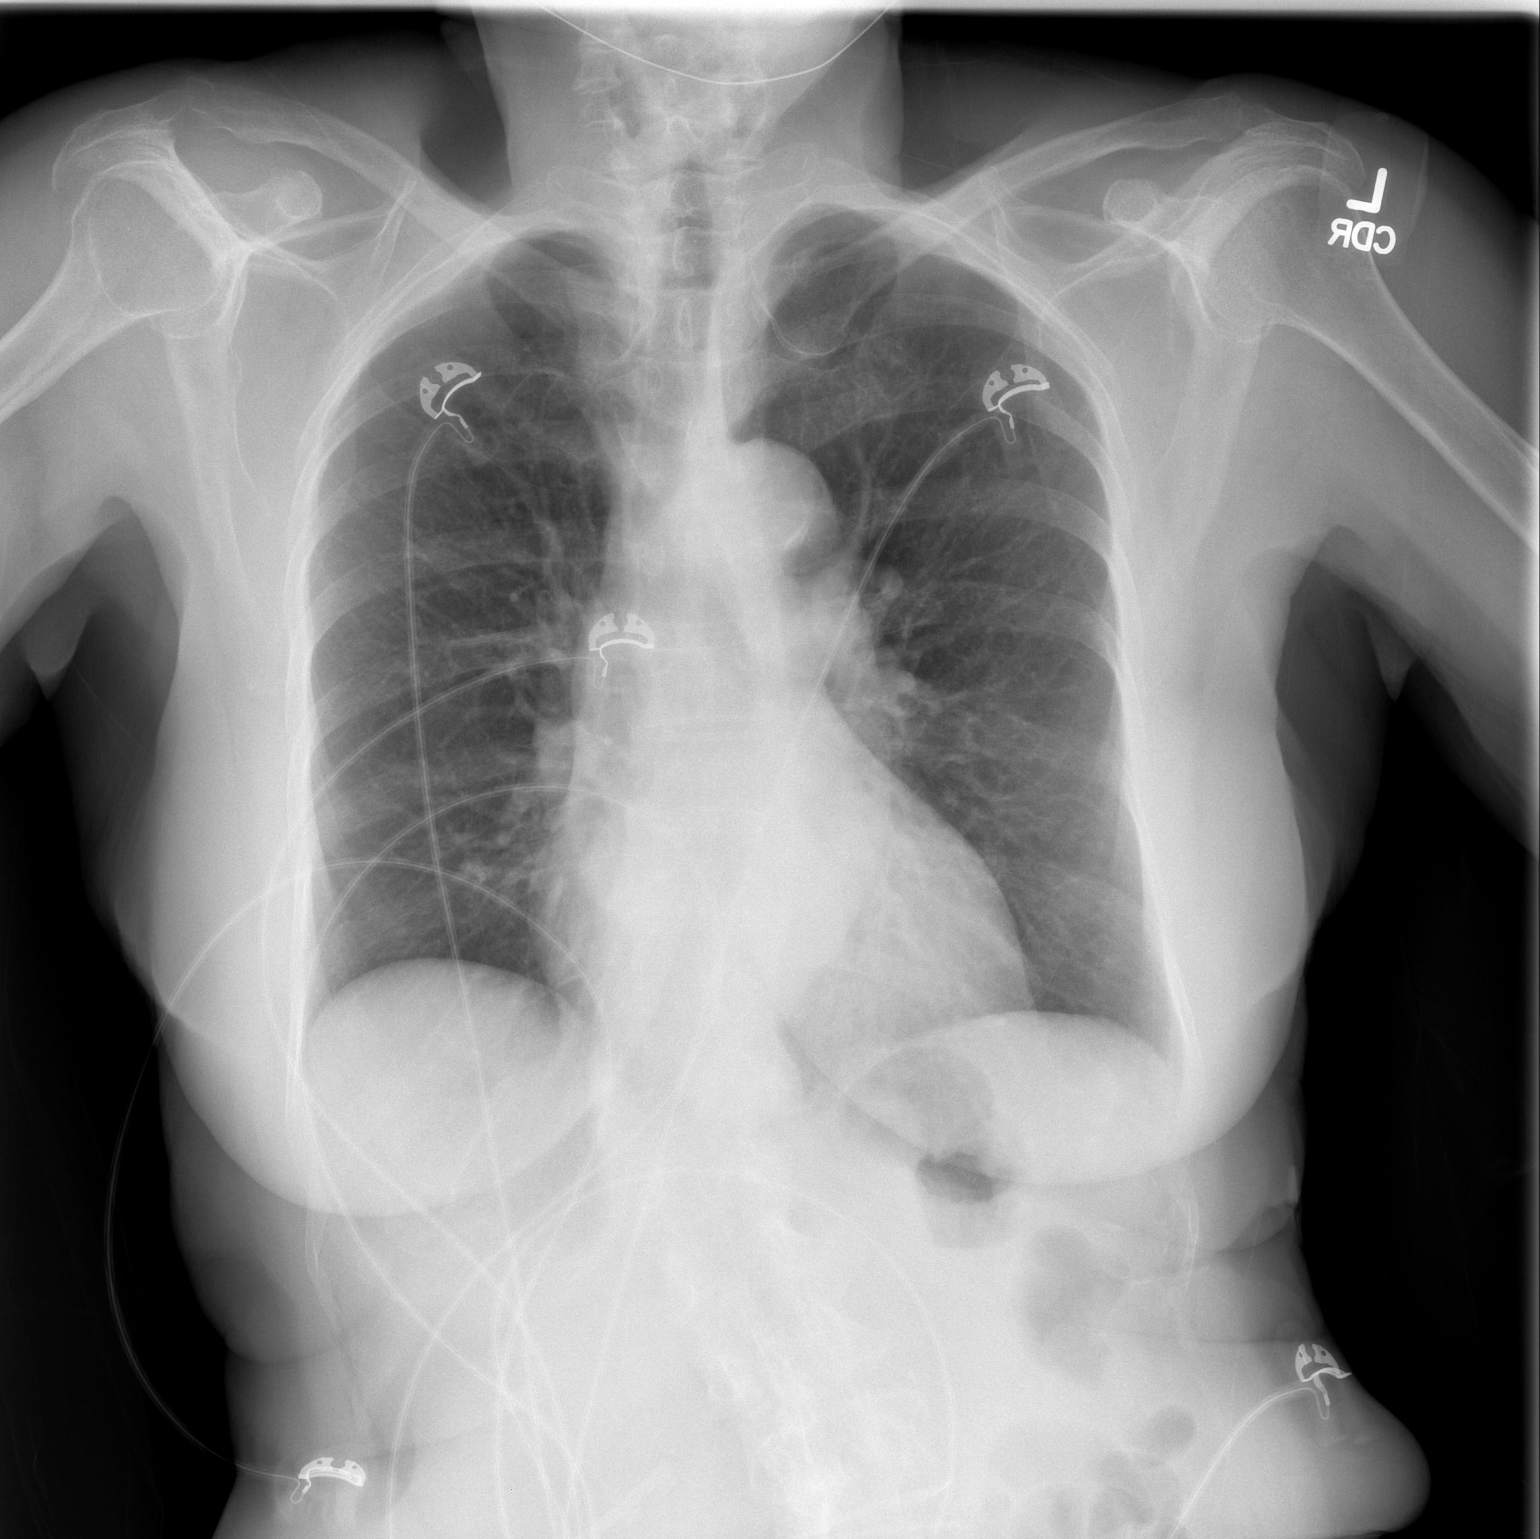

[w chest lat]
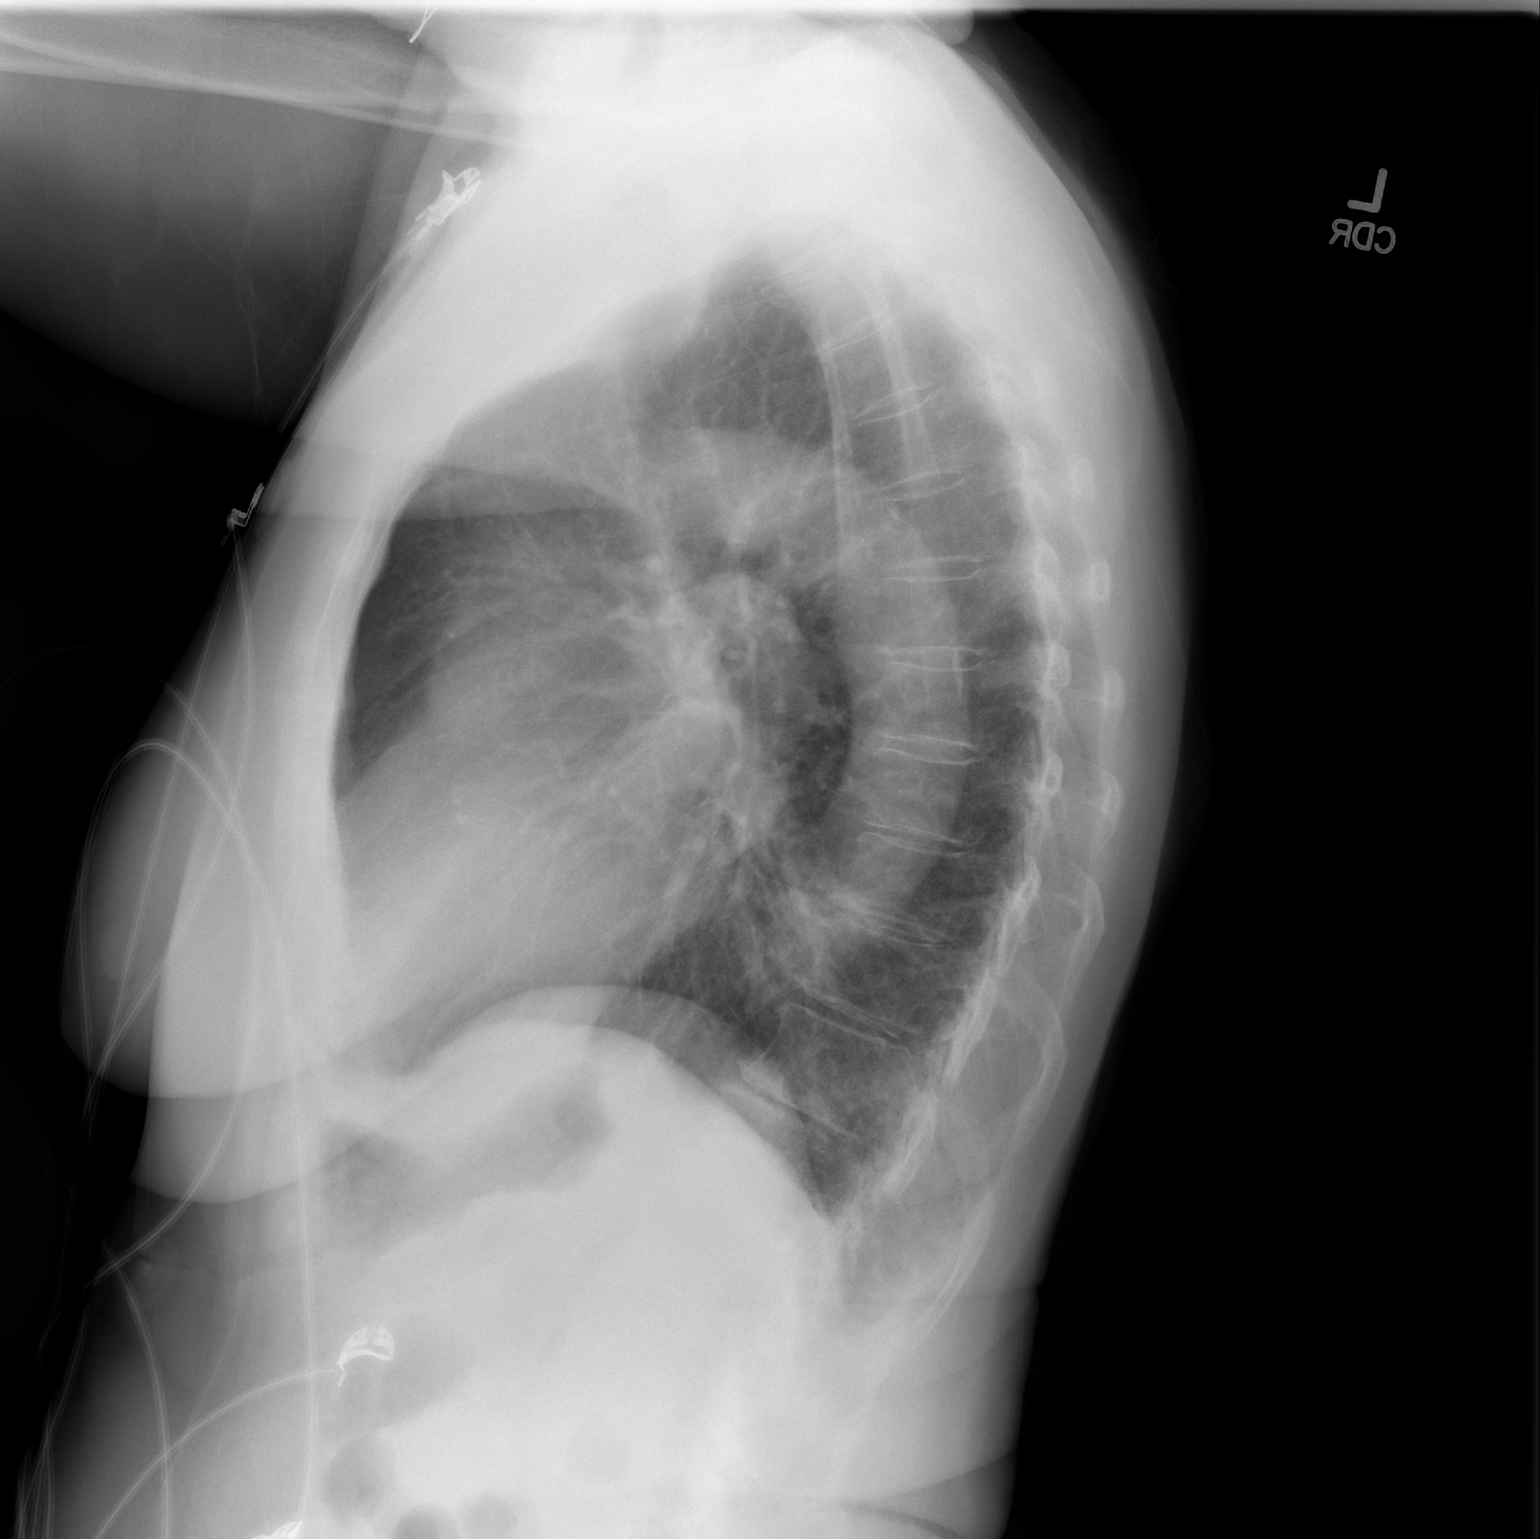

[2 of 2 positions shown; findings below may reference images not displayed]

FINDINGS: Heart and mediastinal contours are within normal limits. No focal
opacities or effusions. No acute bony abnormality.
IMPRESSION: No active cardiopulmonary disease.

## 2020-09-14 DIAGNOSIS — M549 Dorsalgia, unspecified: Secondary | ICD-10-CM | POA: Diagnosis not present

## 2020-09-14 DIAGNOSIS — I10 Essential (primary) hypertension: Secondary | ICD-10-CM | POA: Diagnosis not present

## 2020-09-14 DIAGNOSIS — Z8639 Personal history of other endocrine, nutritional and metabolic disease: Secondary | ICD-10-CM | POA: Diagnosis not present

## 2020-09-29 DIAGNOSIS — Z23 Encounter for immunization: Secondary | ICD-10-CM | POA: Diagnosis not present

## 2020-10-25 DIAGNOSIS — Z23 Encounter for immunization: Secondary | ICD-10-CM | POA: Diagnosis not present
